# Patient Record
Sex: Female | Born: 1965 | Race: White | Hispanic: No | Marital: Married | Smoking: Never smoker
Health system: Southern US, Community
[De-identification: ages and names within clinical notes are randomized; demographics above are authoritative.]

## PROBLEM LIST (undated history)

## (undated) DIAGNOSIS — E079 Disorder of thyroid, unspecified: Secondary | ICD-10-CM

## (undated) DIAGNOSIS — K589 Irritable bowel syndrome without diarrhea: Secondary | ICD-10-CM

## (undated) DIAGNOSIS — I10 Essential (primary) hypertension: Secondary | ICD-10-CM

## (undated) DIAGNOSIS — R0602 Shortness of breath: Secondary | ICD-10-CM

## (undated) DIAGNOSIS — R7303 Prediabetes: Secondary | ICD-10-CM

## (undated) DIAGNOSIS — M549 Dorsalgia, unspecified: Secondary | ICD-10-CM

## (undated) DIAGNOSIS — R079 Chest pain, unspecified: Secondary | ICD-10-CM

## (undated) DIAGNOSIS — K5900 Constipation, unspecified: Secondary | ICD-10-CM

## (undated) DIAGNOSIS — K219 Gastro-esophageal reflux disease without esophagitis: Secondary | ICD-10-CM

## (undated) HISTORY — DX: Shortness of breath: R06.02

## (undated) HISTORY — DX: Dorsalgia, unspecified: M54.9

## (undated) HISTORY — DX: Gastro-esophageal reflux disease without esophagitis: K21.9

## (undated) HISTORY — DX: Constipation, unspecified: K59.00

## (undated) HISTORY — PX: FOOT SURGERY: SHX648

## (undated) HISTORY — DX: Irritable bowel syndrome, unspecified: K58.9

## (undated) HISTORY — DX: Chest pain, unspecified: R07.9

---

## 2003-09-06 ENCOUNTER — Emergency Department (HOSPITAL_COMMUNITY): Admission: EM | Admit: 2003-09-06 | Discharge: 2003-09-07 | Payer: Self-pay | Admitting: Emergency Medicine

## 2003-09-07 ENCOUNTER — Ambulatory Visit (HOSPITAL_COMMUNITY): Admission: RE | Admit: 2003-09-07 | Discharge: 2003-09-07 | Payer: Self-pay

## 2003-09-20 ENCOUNTER — Encounter (INDEPENDENT_AMBULATORY_CARE_PROVIDER_SITE_OTHER): Payer: Self-pay | Admitting: Specialist

## 2003-09-20 ENCOUNTER — Observation Stay (HOSPITAL_COMMUNITY): Admission: RE | Admit: 2003-09-20 | Discharge: 2003-09-21 | Payer: Self-pay | Admitting: General Surgery

## 2005-12-20 ENCOUNTER — Emergency Department: Payer: Self-pay | Admitting: Emergency Medicine

## 2006-10-17 ENCOUNTER — Emergency Department: Payer: Self-pay | Admitting: Emergency Medicine

## 2006-11-01 ENCOUNTER — Emergency Department: Payer: Self-pay | Admitting: Emergency Medicine

## 2007-04-27 ENCOUNTER — Emergency Department: Payer: Self-pay | Admitting: Unknown Physician Specialty

## 2007-07-20 ENCOUNTER — Emergency Department: Payer: Self-pay | Admitting: Emergency Medicine

## 2008-11-20 ENCOUNTER — Inpatient Hospital Stay (HOSPITAL_COMMUNITY): Admission: EM | Admit: 2008-11-20 | Discharge: 2008-11-23 | Payer: Self-pay | Admitting: Emergency Medicine

## 2008-12-17 ENCOUNTER — Ambulatory Visit (HOSPITAL_BASED_OUTPATIENT_CLINIC_OR_DEPARTMENT_OTHER): Admission: RE | Admit: 2008-12-17 | Discharge: 2008-12-17 | Payer: Self-pay | Admitting: Orthopedic Surgery

## 2009-01-31 ENCOUNTER — Encounter: Admission: RE | Admit: 2009-01-31 | Discharge: 2009-01-31 | Payer: Self-pay | Admitting: Orthopedic Surgery

## 2009-02-28 ENCOUNTER — Encounter: Admission: RE | Admit: 2009-02-28 | Discharge: 2009-02-28 | Payer: Self-pay | Admitting: Orthopedic Surgery

## 2009-03-03 ENCOUNTER — Inpatient Hospital Stay (HOSPITAL_COMMUNITY): Admission: RE | Admit: 2009-03-03 | Discharge: 2009-03-06 | Payer: Self-pay | Admitting: Orthopedic Surgery

## 2009-03-03 ENCOUNTER — Ambulatory Visit: Payer: Self-pay | Admitting: Infectious Diseases

## 2009-03-07 ENCOUNTER — Ambulatory Visit: Payer: Self-pay | Admitting: Orthopedic Surgery

## 2009-05-10 ENCOUNTER — Ambulatory Visit (HOSPITAL_COMMUNITY): Admission: RE | Admit: 2009-05-10 | Discharge: 2009-05-10 | Payer: Self-pay | Admitting: Orthopedic Surgery

## 2009-06-03 ENCOUNTER — Ambulatory Visit (HOSPITAL_COMMUNITY): Admission: RE | Admit: 2009-06-03 | Discharge: 2009-06-04 | Payer: Self-pay | Admitting: Orthopedic Surgery

## 2011-01-13 LAB — DIFFERENTIAL
Basophils Absolute: 0.1 10*3/uL (ref 0.0–0.1)
Basophils Relative: 1 % (ref 0–1)
Eosinophils Absolute: 0.1 10*3/uL (ref 0.0–0.7)
Eosinophils Relative: 2 % (ref 0–5)
Lymphocytes Relative: 37 % (ref 12–46)
Lymphs Abs: 2.1 10*3/uL (ref 0.7–4.0)
Monocytes Absolute: 0.4 10*3/uL (ref 0.1–1.0)
Monocytes Relative: 8 % (ref 3–12)
Neutro Abs: 3 10*3/uL (ref 1.7–7.7)
Neutrophils Relative %: 52 % (ref 43–77)

## 2011-01-13 LAB — CBC
HCT: 33.8 % — ABNORMAL LOW (ref 36.0–46.0)
Hemoglobin: 11.2 g/dL — ABNORMAL LOW (ref 12.0–15.0)
Hemoglobin: 9.3 g/dL — ABNORMAL LOW (ref 12.0–15.0)
MCHC: 33.1 g/dL (ref 30.0–36.0)
MCHC: 33.2 g/dL (ref 30.0–36.0)
MCV: 80.9 fL (ref 78.0–100.0)
MCV: 81.8 fL (ref 78.0–100.0)
Platelets: 242 10*3/uL (ref 150–400)
RBC: 3.42 MIL/uL — ABNORMAL LOW (ref 3.87–5.11)
RBC: 4.19 MIL/uL (ref 3.87–5.11)
RDW: 17.3 % — ABNORMAL HIGH (ref 11.5–15.5)
WBC: 5.7 10*3/uL (ref 4.0–10.5)
WBC: 7.2 10*3/uL (ref 4.0–10.5)

## 2011-01-13 LAB — COMPREHENSIVE METABOLIC PANEL
ALT: 20 U/L (ref 0–35)
AST: 19 U/L (ref 0–37)
Albumin: 3.9 g/dL (ref 3.5–5.2)
Alkaline Phosphatase: 68 U/L (ref 39–117)
BUN: 9 mg/dL (ref 6–23)
CO2: 24 mEq/L (ref 19–32)
Calcium: 9.2 mg/dL (ref 8.4–10.5)
Chloride: 103 mEq/L (ref 96–112)
Creatinine, Ser: 0.64 mg/dL (ref 0.4–1.2)
GFR calc Af Amer: >60 mL/min (ref >60)
GFR calc non Af Amer: >60 mL/min (ref >60)
Glucose, Bld: 102 mg/dL — ABNORMAL HIGH (ref 70–99)
Potassium: 3.8 mEq/L (ref 3.5–5.1)
Sodium: 135 mEq/L (ref 135–145)
Total Bilirubin: 0.8 mg/dL (ref 0.3–1.2)
Total Protein: 7.4 g/dL (ref 6.0–8.3)

## 2011-01-13 LAB — SEDIMENTATION RATE: Sed Rate: 37 mm/hr — ABNORMAL HIGH (ref 0–22)

## 2011-01-13 LAB — APTT: aPTT: 30 seconds (ref 24–37)

## 2011-01-13 LAB — GRAM STAIN

## 2011-01-13 LAB — PROTIME-INR
INR: 1 (ref 0.00–1.49)
Prothrombin Time: 13.4 seconds (ref 11.6–15.2)

## 2011-01-13 LAB — ANAEROBIC CULTURE

## 2011-01-13 LAB — URINALYSIS, ROUTINE W REFLEX MICROSCOPIC
Bilirubin Urine: NEGATIVE
Glucose, UA: NEGATIVE mg/dL
Hgb urine dipstick: NEGATIVE
Ketones, ur: NEGATIVE mg/dL
Nitrite: NEGATIVE
Protein, ur: NEGATIVE mg/dL
Specific Gravity, Urine: 1.028 (ref 1.005–1.030)
Urobilinogen, UA: 0.2 mg/dL (ref 0.0–1.0)
pH: 5 (ref 5.0–8.0)

## 2011-01-13 LAB — BASIC METABOLIC PANEL
CO2: 25 mEq/L (ref 19–32)
Chloride: 103 mEq/L (ref 96–112)
Creatinine, Ser: 0.7 mg/dL (ref 0.4–1.2)
GFR calc Af Amer: >60 mL/min (ref >60)
Potassium: 3.9 mEq/L (ref 3.5–5.1)
Sodium: 135 mEq/L (ref 135–145)

## 2011-01-13 LAB — URINE MICROSCOPIC-ADD ON

## 2011-01-13 LAB — TISSUE CULTURE

## 2011-01-14 LAB — DIFFERENTIAL
Eosinophils Absolute: 0.1 10*3/uL (ref 0.0–0.7)
Eosinophils Relative: 2 % (ref 0–5)
Lymphocytes Relative: 34 % (ref 12–46)
Lymphs Abs: 1.6 10*3/uL (ref 0.7–4.0)
Monocytes Absolute: 0.4 10*3/uL (ref 0.1–1.0)

## 2011-01-14 LAB — CBC
HCT: 32.8 % — ABNORMAL LOW (ref 36.0–46.0)
MCHC: 33.3 g/dL (ref 30.0–36.0)
MCV: 81.2 fL (ref 78.0–100.0)
Platelets: 204 10*3/uL (ref 150–400)
RBC: 4.04 MIL/uL (ref 3.87–5.11)
WBC: 4.8 10*3/uL (ref 4.0–10.5)

## 2011-01-16 LAB — URINE CULTURE: Colony Count: 40000

## 2011-01-16 LAB — BASIC METABOLIC PANEL
BUN: 6 mg/dL (ref 6–23)
CO2: 28 mEq/L (ref 19–32)
CO2: 28 mEq/L (ref 19–32)
CO2: 29 mEq/L (ref 19–32)
Calcium: 8.9 mg/dL (ref 8.4–10.5)
Chloride: 100 mEq/L (ref 96–112)
Chloride: 103 mEq/L (ref 96–112)
Chloride: 104 mEq/L (ref 96–112)
GFR calc Af Amer: >60 mL/min (ref >60)
GFR calc Af Amer: >60 mL/min (ref >60)
Glucose, Bld: 131 mg/dL — ABNORMAL HIGH (ref 70–99)
Potassium: 3.4 mEq/L — ABNORMAL LOW (ref 3.5–5.1)
Potassium: 4.1 mEq/L (ref 3.5–5.1)
Potassium: 4.5 mEq/L (ref 3.5–5.1)
Sodium: 137 mEq/L (ref 135–145)

## 2011-01-16 LAB — WOUND CULTURE

## 2011-01-16 LAB — TISSUE CULTURE: Culture: NO GROWTH

## 2011-01-16 LAB — CBC
HCT: 25.5 % — ABNORMAL LOW (ref 36.0–46.0)
HCT: 27.1 % — ABNORMAL LOW (ref 36.0–46.0)
HCT: 30.3 % — ABNORMAL LOW (ref 36.0–46.0)
HCT: 36.9 % (ref 36.0–46.0)
Hemoglobin: 10 g/dL — ABNORMAL LOW (ref 12.0–15.0)
Hemoglobin: 12.3 g/dL (ref 12.0–15.0)
MCHC: 33.2 g/dL (ref 30.0–36.0)
MCHC: 33.4 g/dL (ref 30.0–36.0)
MCHC: 33.8 g/dL (ref 30.0–36.0)
MCV: 84.5 fL (ref 78.0–100.0)
MCV: 85 fL (ref 78.0–100.0)
Platelets: 197 10*3/uL (ref 150–400)
Platelets: 275 10*3/uL (ref 150–400)
RBC: 3.01 MIL/uL — ABNORMAL LOW (ref 3.87–5.11)
RBC: 3.21 MIL/uL — ABNORMAL LOW (ref 3.87–5.11)
RBC: 3.52 MIL/uL — ABNORMAL LOW (ref 3.87–5.11)
RDW: 15.8 % — ABNORMAL HIGH (ref 11.5–15.5)
RDW: 15.9 % — ABNORMAL HIGH (ref 11.5–15.5)
WBC: 4.6 10*3/uL (ref 4.0–10.5)
WBC: 5.3 10*3/uL (ref 4.0–10.5)

## 2011-01-16 LAB — GRAM STAIN

## 2011-01-16 LAB — DIFFERENTIAL
Basophils Relative: 1 % (ref 0–1)
Lymphocytes Relative: 32 % (ref 12–46)
Lymphs Abs: 2 10*3/uL (ref 0.7–4.0)
Monocytes Relative: 7 % (ref 3–12)
Neutro Abs: 3.7 10*3/uL (ref 1.7–7.7)
Neutrophils Relative %: 59 % (ref 43–77)

## 2011-01-16 LAB — PROTIME-INR
INR: 1 (ref 0.00–1.49)
INR: 1.1 (ref 0.00–1.49)

## 2011-01-16 LAB — ANAEROBIC CULTURE

## 2011-01-16 LAB — URINALYSIS, ROUTINE W REFLEX MICROSCOPIC
Bilirubin Urine: NEGATIVE
Nitrite: NEGATIVE
Specific Gravity, Urine: 1.022 (ref 1.005–1.030)
Urobilinogen, UA: 0.2 mg/dL (ref 0.0–1.0)

## 2011-01-16 LAB — COMPREHENSIVE METABOLIC PANEL
BUN: 10 mg/dL (ref 6–23)
Calcium: 9.8 mg/dL (ref 8.4–10.5)
Creatinine, Ser: 0.66 mg/dL (ref 0.4–1.2)
Glucose, Bld: 100 mg/dL — ABNORMAL HIGH (ref 70–99)
Total Protein: 7.3 g/dL (ref 6.0–8.3)

## 2011-01-16 LAB — URINE MICROSCOPIC-ADD ON

## 2011-01-16 LAB — CULTURE, ROUTINE-ABSCESS

## 2011-01-16 LAB — APTT: aPTT: 28 seconds (ref 24–37)

## 2011-01-16 LAB — HEMOGLOBIN AND HEMATOCRIT, BLOOD: HCT: 26.5 % — ABNORMAL LOW (ref 36.0–46.0)

## 2011-01-16 LAB — TYPE AND SCREEN
ABO/RH(D): A POS
Antibody Screen: NEGATIVE

## 2011-01-23 LAB — BASIC METABOLIC PANEL
CO2: 24 mEq/L (ref 19–32)
Calcium: 8.1 mg/dL — ABNORMAL LOW (ref 8.4–10.5)
Chloride: 104 mEq/L (ref 96–112)
Chloride: 106 mEq/L (ref 96–112)
Chloride: 100 mEq/L (ref 96–112)
Creatinine, Ser: 0.66 mg/dL (ref 0.4–1.2)
Creatinine, Ser: 0.64 mg/dL (ref 0.4–1.2)
GFR calc Af Amer: >60 mL/min (ref >60)
GFR calc Af Amer: >60 mL/min (ref >60)
GFR calc Af Amer: >60 mL/min (ref >60)
Potassium: 3.9 mEq/L (ref 3.5–5.1)

## 2011-01-23 LAB — ABO/RH: ABO/RH(D): A POS

## 2011-01-23 LAB — CBC
HCT: 29.1 % — ABNORMAL LOW (ref 36.0–46.0)
HCT: 30.9 % — ABNORMAL LOW (ref 36.0–46.0)
Hemoglobin: 10.3 g/dL — ABNORMAL LOW (ref 12.0–15.0)
MCHC: 33.9 g/dL (ref 30.0–36.0)
MCV: 82.3 fL (ref 78.0–100.0)
MCV: 82.4 fL (ref 78.0–100.0)
MCV: 83 fL (ref 78.0–100.0)
MCV: 83.2 fL (ref 78.0–100.0)
RBC: 3.04 MIL/uL — ABNORMAL LOW (ref 3.87–5.11)
RBC: 3.5 MIL/uL — ABNORMAL LOW (ref 3.87–5.11)
RBC: 3.75 MIL/uL — ABNORMAL LOW (ref 3.87–5.11)
RBC: 3.35 MIL/uL — ABNORMAL LOW (ref 3.87–5.11)
WBC: 6.3 10*3/uL (ref 4.0–10.5)
WBC: 6.4 10*3/uL (ref 4.0–10.5)
WBC: 9.5 10*3/uL (ref 4.0–10.5)

## 2011-01-23 LAB — POCT I-STAT, CHEM 8
BUN: 13 mg/dL (ref 6–23)
Calcium, Ion: 1.14 mmol/L (ref 1.12–1.32)
Chloride: 108 mEq/L (ref 96–112)
Creatinine, Ser: 0.5 mg/dL (ref 0.4–1.2)
Glucose, Bld: 121 mg/dL — ABNORMAL HIGH (ref 70–99)
HCT: 33 % — ABNORMAL LOW (ref 36.0–46.0)
Hemoglobin: 11.2 g/dL — ABNORMAL LOW (ref 12.0–15.0)
Potassium: 4 mEq/L (ref 3.5–5.1)
Sodium: 140 mEq/L (ref 135–145)
TCO2: 22 mmol/L (ref 0–100)

## 2011-01-23 LAB — TYPE AND SCREEN: Antibody Screen: NEGATIVE

## 2011-01-23 LAB — PREPARE RBC (CROSSMATCH)

## 2011-02-20 NOTE — Op Note (Signed)
Shelby Clarke, Shelby Clarke              ACCOUNT NO.:  [id number]   MEDICAL RECORD NO.:  [id number]          PATIENT TYPE:  OIB   LOCATION:  5012                         FACILITY:  [facility]   PHYSICIAN:  Doralee Albino. Carola Frost, M.D. DATE OF BIRTH:  05-08-1966   DATE OF PROCEDURE:  06/03/2009  DATE OF DISCHARGE:  06/04/2009                               OPERATIVE REPORT   PREOPERATIVE DIAGNOSIS:  Chronic right ankle syndesmotic disruption.   POSTOPERATIVE DIAGNOSIS:  Chronic right ankle syndesmotic disruption.   PROCEDURE:  Right ankle syndesmotic fusion using iliac crest autograft.   SURGEON:  Doralee Albino. Carola Frost, MD   ASSISTANT:  Mearl Latin, PA   ANESTHESIA:  General.   COMPLICATIONS:  None.   SPECIMENS:  Bone at right fibula.  Pathologic findings; no organisms.   TOURNIQUET:  None.   DISPOSITION:  To PACU.   CONDITION:  Stable.   BRIEF SUMMARY OF INDICATIONS FOR PROCEDURE:  Shelby Clarke is a 45-year-  old female with a past medical history notable for syndesmotic  disruption with loss of fixation complicated by infection who underwent  multiple debridements and operations to resolve her osteomyelitis.  She  now presents with syndesmotic instability which is longstanding and a  migrated tibiotalar joint.  I discussed with her preoperatively the  risks and benefits of surgery including possibility of recurrent  infection, need for further surgery, symptomatic hardware, DVT, PE, and  multiple others.  After full discussion, she did wish to proceed.   BRIEF DESCRIPTION OF PROCEDURE:  Ms. Selman was taken to the operating  room where general anesthesia was induced.  Her right lower extremity as  well as her right iliac crest were prepped and draped in usual sterile  fashion.  I began distally, opening the incision laterally, carrying  dissection down to the distal fibula.  We obtained direct bone cultures  and did not identify any gross evidence of infection.  Consequently  using fresh  instruments and attire, we turned our attention to the  proximal hip where we proceeded with bone graft harvesting, carrying the  dissection down to the brim making a trapdoor in the iliac crest,  reflecting the door, and scooping out a large amount of cancellous  autograft.  We were informed that there was no evidence of infection by  pathology of the specimen sent and consequently we turned our attention  back distally where once more thorough debridement of the syndesmosis  was undertaken removing all fibrous tissue.  I then placed some iliac  crest bone directly in the syndesmotic interval as well as placing some  through the posterior aspect of this interval and packing some around  the anterior aspect and this resulted in excellent apposition and fill  of this space followed by placement of a 5-hole, one-third tubular plate  that I extended below the area of the previous fracture.  There appeared  to be quite a bit of bone loss at this level of the fibula and I was  concerned about the possibility of fatigue failure and wanted to augment  this fusion site.  I did not place  any hardware distal to the ankle  joint.  Given that there was again this limited area of contact, I did  not want the screw heads to be prominent.  This resulted in excellent  compression across the syndesmotic interval with an anatomically reduced  tibiotalar joint.  The wound was irrigated and closed in standard  layered fashion with 2-0 Vicryl and 3-0 nylon.  Sterile gently  compressive dressing was applied and a posterior stirrup splint.  The  patient was awakened from anesthesia and transported to PACU in stable  condition.   The proximal hip incision was closed in similar fashion using #1 Vicryl  for the trapdoor periosteum, 0 Vicryl for the deep subcu, and 2-0 Vicryl  and nylon for the skin.  Montez Morita, PA-C, assisted me throughout these  procedures and was necessary for the safe and effective completion  of  the case.   PROGNOSIS:  Ms. Warrick will be nonweightbearing on the right lower  extremity for another 6-8 weeks with graduated weightbearing thereafter.  We will see her back in 10 days for removal of sutures.      Doralee Albino. Carola Frost, M.D.  Electronically Signed     MHH/MEDQ  D:  10/13/2009  T:  10/14/2009  Job:  [id number]

## 2011-02-20 NOTE — Discharge Summary (Signed)
Shelby Clarke, Shelby Clarke              ACCOUNT NO.:  [id number]   MEDICAL RECORD NO.:  [id number]          PATIENT TYPE:  INP   LOCATION:  5023                         FACILITY:  [facility]   PHYSICIAN:  Doralee Albino. Carola Frost, M.D. DATE OF BIRTH:  09-Dec-1965   DATE OF ADMISSION:  03/03/2009  DATE OF DISCHARGE:  03/05/2009                               DISCHARGE SUMMARY   DISCHARGE DIAGNOSES:  1. Infected right ankle fracture involving tibia and fibula with      infection of right ankle joint.  2. Fibula nonunion.  3. Open medial wound right ankle.  4. Loss of syndesmotic reduction, right ankle.  5. Impacted cortical fragment within the talar body and neck from      initial injury.  6. Right ankle posttraumatic contracture.  7. Acute blood loss anemia, asymptomatic and stable.   ADDITIONAL DISCHARGE DIAGNOSES:  Gastroesophageal reflux disease.   PROCEDURES PERFORMED:  1. Partial excision of right tibia for osteomyelitis.  2. Partial excision of right fibula for osteomyelitis.  3. Partial excision of talus for free cortical fragment impacted      within the talar body.  4. Removal of deep implant, right tibia and fibula.  5. Open reduction of ankle and syndesmosis with spanning external      fixation.  6. Application of wound VAC, medial right ankle draining sinus.  7. Arthrotomy with irrigation of right ankle joint.  8. Manipulation of right ankle joint under anesthesia.   BRIEF HISTORY AND HOSPITAL COURSE:  Shelby Clarke is a 45 year old female  who sustained open right ankle fracture approximately 3 months ago.  She  was initially treated with I and D and ORIF of the right ankle at that  time.  Over the next several months, she began to experience several  complications, first of which included loss of reduction of her right  syndesmosis and was subsequently taken back for tight rope fixation of  her right syndesmosis.  She continued to have draining wounds both from  her lateral wound as  well as from the traumatic wound medial aspect of  her right ankle.  After a tight rope fixation of her right ankle, she  began to continue to experience pain and widening of her syndesmosis  with suspected nonunion of fibula.  Shelby Clarke was referred to Dr. Carola Frost  in Orthopedic Trauma Service for workup and subsequent management.  Initial workup was performed on Feb 09, 2009, we did note persistent  draining wounds both medially and laterally and were somewhat suspicious  for deep infection of her right ankle.  As such, we did obtain a CRP and  a sed rate as part of our initial workup.  Her CRP was 7.8 and sed rate  was 36.  We did obtain a CT scan as well, which demonstrated a nonunion  of the distal fibula of the right ankle with union of the medial  malleolar fragment.  We did observe a loose body in the anterior aspect  of the talus suspicious for cortical bone fragment.  We also noted  persistent widening of her syndesmosis as well.  There was significant  arthritis of her right ankle present as well.  As such, we did discussed  multiple treatment options with the patient and her husband of which we  opted for, removal of hardware, irrigation and debridement, and  placement of an external fixator with antibiotic treatment for deep  infection.  Shelby Clarke underwent the procedure described up above and  tolerated the procedures well.  She was placed in an external fixator.  Irrigation and debridement was performed and cultures were obtained from  foreign material from her right ankle most notably the tight rope  suture.  We did obtain an intraoperative Gram stain on the tight rope  suture, which demonstrated Gram-negative rods.  After surgery, Ms.  Clarke was brought to the PACU for recovery from anesthesia and then  transported to orthopedic floor for continued observation and pain  control.  Shelby Clarke was started on IV antibiotics, which included  vancomycin, rifampin, and Zosyn for  broad-spectrum coverage given the  positive Gram stain and suspected infection of her right ankle.  We did  consult Infectious Disease and they followed along closely during the  patient's hospital stay.  Shelby Clarke hospital stay was relatively  uncomplicated, she did stay for 2 days in length.  She progressed well  with physical therapy and had relatively little complaints.  She did  note soreness in her right ankle, but overall progressed each day.  On  postop day #1, Shelby Clarke was doing well, again complaints of soreness  in right lower extremities and nausea, but her nausea did improve with  Reglan and denied any numbness or tingling.  No chest pain or shortness  of breath.  No nausea, vomiting, or diarrhea.  Vital signs were  unremarkable and labs were unremarkable as well.  We did initiate DVT  prophylaxis with Coumadin and Lovenox serving as a bridge given the  anticipated limited mobility over the next several weeks.  Physical exam  was unremarkable.  Her ex-fix was intact.  Hemovac was draining.  Wound  VAC was in place.  No motor or sensory disturbances were noted.  We did  make note of Dr. Bonnetta Barry consult of an Infectious Disease Service and  very grateful for his involvement with the care of this patient.  We did  have hopes that we would be able discharge Shelby Clarke on oral  antibiotics should the bacteria be susceptible to it.  On postop day #2,  the patient continued to do well.  Home health needs were arranged  including services for [facility] changes as well as home health PT and OT as  well as skilled nursing for INR monitoring and Coumadin dosing.  On  postop day #2, the patient was deemed stable for discharge to home with  necessary DME and home health needs.   Clinical encounter note for postop day #2:  Subjective/objective:  The patient is doing well, wants to go home  today, right ankle is sore.  She did have an in-and-out cath last night,  but feels much better.  No  numbness or tingling is noted.  No chest pain  or shortness of breath.  No lightheadedness.  Increased appetite is  noted.   PHYSICAL EXAMINATION:  VITAL SIGNS:  Temperature 98.4, heart rate 81,  respirations 20 at 98% on room air, and BP 123/68.  GENERAL:  The patient is awake and alert in no acute distress.  LUNGS:  Clear.  CARDIAC:  S1 and S2 are noted.  ABDOMEN:  Soft, nontender.  Positive bowel sounds.  EXTREMITIES:  Right lower extremity, external fixator is intact.  Pin  sites look outstanding.  VAC is in place on the medial right ankle.  Hemovac has been discontinued.  Dressings has been changed and incisions  are clean, dry, and intact.  No erythema or purulent drainage is noted.  Scant serosanguineous discharge is noted.  EHL, FHL, and lesser toe  function are intact.  Deep peroneal nerve, superficial peroneal nerve,  and tibial nerve motor function are intact.  No deep calf tenderness is  noted.  Extremities are warm.  Palpable dorsalis pedis pulse is noted.  There is edema of the foot noted.   Sodium 138, potassium 4.1, chloride 100, bicarb 28, BUN 6, creatinine  0.76, and glucose 120.  Hemoglobin 8.6, hematocrit 25.5, white blood  cells 5.3, platelets 197, and INR is 2.1.  Final cultures are back on  the aerobic specimens, which demonstrate pseudomonas aeruginosa, which  is sensitive to Cipro.  Anaerobic cultures are still pending, but no  growth to date.   ASSESSMENT AND PLAN:  A 45 year old female status post removal of  hardware of right ankle and incision and drainage for right ankle  osteomyelitis with application of spanning external fixator right ankle  on postop day #2.  1. Right ankle osteomyelitis status post incision and drainage and      external fixation.      a.     Nonweightbearing right lower extremity.      b.     Physical therapy and occupational therapy.  Home health PT       and OT have been arranged.      c.     Toe range of motion encouraged for  swelling control as is       elevation and ice on the right lower extremity and particularly       elevation above the heart.      d.     Dressings changed today except for the VAC.  Her Hemovac       drain was discontinued as well.      e.     The patient will need VAC changes tomorrow by home health       and will require dressing VAC changes every 3 days.  We anticipate       2-3 weeks of VAC treatment, but may extend beyond that depending       on the condition of the wound.  Ultimate goal will be delayed       primary closure versus flap.  2. Gastroesophageal reflux disease.  Continue with Protonix.  3. Infectious Disease.  Cultures currently show Pseudomonas      aeruginosa, which is sensitive to Cipro and no growth to date from      anaerobic culture.  We will change antibiotic regimen to Cipro 750      mg p.o. b.i.d. x6 week.  We will follow up cultures on Tuesday and      adjust antibiotics accordingly until then we will discharge the      patient home with Cipro.  I greatly appreciate Infectious Disease      involvement.  4. Wound care.  VAC change tomorrow, right ankle by home health.  5. Pain.  Continue with p.o. medications.  6. Fluids, electrolytes, nutrition.  The patient must void on her own      before discharge home today.  7. Acute blood loss  anemia.  Asymptomatic and stable.  8. The patient is relatively young and should have ample reserve thus      making transfusion not necessary.  9. Disposition.  Discharged home today with home health once the      patient voids on own.  10.Antibiotic therapy.  We will include Cipro 750 mg p.o. b.i.d. x6      weeks and again, we will adjust her as needed based on anaerobic      cultures.   DISCHARGE MEDICATIONS:  1. Percocet 5/325 one to two p.o. q.4-6 h. as needed for pain.  2. Oxycodone 5 mg 1-2 every 3 hours as needed for breakthrough pain.  3. Robaxin 500 mg 1-2 every 6 hours as needed for muscle spasms.  4. Coumadin 5  mg take as directed, which will be monitored in despite      home health pharmacy.  5. Ciprofloxacin 750 mg 1 p.o. every 12 hours for 6 weeks.  I given      cause concerns.  I have written 2 Cipro prescriptions for Cipro, 1      for 250 mg and 1 for 500 mg, which will be taken together b.i.d. to      create a 750 mg tablet.   ADDITIONAL MEDICATIONS:  The patient has no home medications to resume.   DISCHARGE INSTRUCTIONS AND PLANS:  Ms. Schlafer did sustain a significant  injury to her right lower extremity.  She will continue to be  nonweightbearing on her right lower extremity while in the external  fixator.  She will continue on antibiotics as prescribed for coverage of  her deep infection.  We do anticipate return to the OR in the near  future for eventual closure of her medial wound.  However, until that  time, she will continue with vac changes to the medial wound of her  right ankle every 3 days, which will be carried out by home health.  After wound are healed and infections are cleared, we would anticipate  return to the OR at some point in the future for fusion of the  syndesmosis.  However, we are hope for that she may go on and scar and  not require any further open procedures.  Given her anticipated  immobility as well as obesity.  She does remain at an elevated risk for  the development of the DVT.  As such, she will be on Coumadin for DVT  prophylaxis for the next 6 weeks.  Her INR upon discharge was in  therapeutic range of 2.1.  With regards to wound care, I did provide Ms.  Cuen with a wound care sheet, now which clearly notes proper care for  her external fixator as well as care for her surgical wounds.  Again,  VAC changes will be performed by the home health agency and did suggest  to Ms. Pacifico that prior to [facility] changes she should take pain medication  to make VAC change more comfortable.  Ms. Rabon will be discharged home  with home health PT and OT, which she  should participate in while  maintaining nonweightbearing status on her right lower extremity.  She  should continue with digit range of motion, ice, and elevation of her  right lower extremity as well as Ace wrapping to help with swelling  control.  We will see Ms. Schaible back in 10-14 days for reevaluation  hopefully her surgical wound have healed in and sutures can be removed.  At that  time, we will also evaluate the medial wound, which has been  treated with the wound VAC for closure.  Again, Ms. Suchecki will be on  antibiotic treatment for over next 6 weeks, which at this point will  include Cipro 750 mg p.o. b.i.d. based on current final aerobic  cultures.  Anaerobic cultures are still pending and any antibiotic  adjustments will be made on follow up.  We are hopeful that no  additional organisms will grow out and oral ciprofloxacin will be the  sole treatment.  Again, we greatly appreciate the assistance of the  Infectious Disease Service during Ms. Asmus's hospital stay.  Should  the patient have any questions or any concern prior to follow visit, she  is to feel free to contact the office and we will address her issues or  concerns immediately.  Again, with regards to DVT prophylaxis, she will  be on Coumadin therapy, again this will be monitored by the [facility] and dosed out by the Pharmacy as well.      Mearl Latin, PA      Doralee Albino. Carola Frost, M.D.  Electronically Signed    KWP/MEDQ  D:  03/05/2009  T:  03/06/2009  Job:  [id number]

## 2011-02-20 NOTE — Op Note (Signed)
Shelby Clarke, [place]              ACCOUNT NO.:  [id number]   MEDICAL RECORD NO.:  [id number]          PATIENT TYPE:  AMB   LOCATION:  DSC                          FACILITY:  [facility]   PHYSICIAN:  Mila Homer. Sherlean Foot, M.D. DATE OF BIRTH:  Jul 03, 1966   DATE OF PROCEDURE:  12/17/2008  DATE OF DISCHARGE:                               OPERATIVE REPORT   SURGEON:  Mila Homer. Sherlean Foot, M.D.   ASSISTANT:  None.   ANESTHESIA:  General.   PREOPERATIVE DIAGNOSIS:  Right syndesmotic injury, status post  bimalleolar open reduction internal fixation.   POSTOPERATIVE DIAGNOSIS:  Right syndesmotic injury status post  bimalleolar open reduction internal fixation.   PROCEDURE:  Right syndesmotic repair.   INDICATION FOR PROCEDURE:  The patient is a 45 year old white female  status post a very complicated fracture dislocation in the ankle and  subsequent ORIF by me 3 weeks ago.  Subsequently, the mortise from the  medial side was wide enough I felt syndesmotic repair was necessary.  Informed consent obtained.   DESCRIPTION OF PROCEDURE:  The patient was laid supine, administered  general anesthesia, the right leg was prepped and draped in usual  sterile fashion.  I used the OC device C-arm to find where I would a  make percutaneous incision, made that through the previous lateral  incision, and bluntly dissected down to the plate.  I then placed the  tight wire from [employer] and with the foot in 90 degrees neutral flexion,  squeezed down and tied that down tightly.  I did get some narrowing of  the medial mortise.  I then lavaged and closed with 0 Vicryl and 3-0  vertical mattress sutures there.  I then lavaged the skin and soft  tissue where the traumatic wound was medially and then dressed with  Xeroform, dry sponges, sterile Webril and Ace wrap.   COMPLICATIONS:  None.   DRAINAGE:  None.           ______________________________  Mila Homer. Sherlean Foot, M.D.     SDL/MEDQ  D:  12/17/2008  T:   12/17/2008  Job:  [id number]

## 2011-02-20 NOTE — Op Note (Signed)
[name], [name]              ACCOUNT NO.:  [id number]   MEDICAL RECORD NO.:  [id number]          PATIENT TYPE:  INP   LOCATION:  5023                         FACILITY:  [facility]   PHYSICIAN:  Doralee Albino. Carola Frost, M.D. DATE OF BIRTH:  [birth year]   DATE OF PROCEDURE:  03/03/2009  DATE OF DISCHARGE:                               OPERATIVE REPORT   PREOPERATIVE DIAGNOSES:  1. Infected right ankle fracture involving the tibia and fibula.  2. Suspected infection of the ankle joint.  3. Fibula nonunion.  4. Open medial wound.  5. Loss of syndesmotic reduction.  6. Impacted cortical fragment within the talar body and neck from      initial injury.  7. Right ankle posttraumatic contracture.   POSTOPERATIVE DIAGNOSES:  1. Infected right ankle fracture involving the tibia and fibula.  2. Suspected infection of the ankle joint.  3. Fibula nonunion.  4. Open medial wound.  5. Loss of syndesmotic reduction.  6. Impacted cortical fragment within the talar body and neck from      initial injury.  7. Right ankle posttraumatic contracture.   PROCEDURES:  1. Partial excision of tibia for osteomyelitis.  2. Partial excision of fibula for osteomyelitis.  3. Partial excision of talus for free cortical fragment impacted      within the talar body.  4. Removal of a deep implant tibia and fibula.  5. Open reduction of the ankle and syndesmosis with spanning external      fixation.  6. Application of wound VAC to the medial draining sinus.  7. Arthrotomy with irrigation of the ankle joint.  8. Manipulation of ankle joint under anesthesia.   SURGEON:  Doralee Albino. Carola Frost, MD   ASSISTANT:  Mearl Latin, PA   ANESTHESIA:  General.   SPECIMENS:  Five, all sent to Microbiology for Gram stain and culture.  Gram stain findings notable for Gram-negative rods on the TightRope  device.   TOURNIQUET:  None.   ESTIMATED BLOOD LOSS:  150 mL.   COMPLICATIONS:  None.   DRAINS:  One wound VAC.   DISPOSITION:  To PACU.   CONDITION:  Stable.   BRIEF SUMMARY OF INDICATION FOR PROCEDURE:  Shelby Clarke is a 45-year-  old female, status post an open ankle fracture 3 months ago.  She was  treated with internal fixation including use of the TightRope device to  repair syndesmosis.  The patient has had a persistent low-level drainage  since that time, both medially and laterally.  CRP and sed rate were  elevated preoperatively.  The patient also had an ankle contracture that  developed as a result of her prolonged splint wear and pain restricting  motion.  I discussed with her preoperatively the risks and benefits of  surgery including the possibility of failure to resolve her infection,  need for further surgery including fusion and syndesmosis, failure to  maintain her ankle range of motion, and multiple others including DVT,  PE, and anesthetic and antibiotic complications.  After full discussion,  she and her husband wished to proceed.   BRIEF SUMMARY OF PROCEDURE:  Ms.  Clarke was taken to operating room  where general anesthesia was induced.  There was some miscommunication  with Anesthesia staff, and the patient did receive 2 g of Ancef prior to  beginning the procedure with the plan having been to hold antibiotics  until we had obtained cultures.  The right lower extremity was scrubbed  with a chlorhexidine brush and then a standard prep and drape performed  after that with Betadine.  I began medially by excising the draining  sinus and necrotic tissue lining the sinus tract down to the medial  tibia.  This was in line with the old horizontal traumatic incision.  The sinus tract down to the FiberWire suture, this was removed sharply,  and this material sent for culture, and Gram stain yielding a result of  positive for Gram-negative rods.  There was extensive fibrinous material  and soft necrotic bone lining this tract suture.  Serial curettes were  used to follow it along.   After a brief irrigation, then turned  attention to the lateral side where the lateral wound was reopened and  here again I ellipsed the draining sinus, which track down to the bone  and the TightRope device as well.  I then removed all the screws and  plate as well as the remaining TightRope.  Curettes were also passed  into the screw holes.  The distal fibula fracture site was examined and  found to be a nonunion consistent with our expectation.  There was some  debris in the anterior syndesmosis and some bone here was excised out.  I also used a curette and excised all the infected necrotic bone along  the rather large tunnel through the bone in the area of the TightRope.  This was thoroughly irrigated.  I then extended the incision distally to  enable an arthrotomy of the tibiotalar joint and also to try to find  identify and remove a free fragment of bone that we felt it would be a  nidus for infection, which was driven at the time of fracture into the  talus, though it clearly came from out of the fibula or the tibia and  was cortical in nature.  C-arm assisted with identification of this  fragment.  I then used the Glorious Peach passing it on either side of the  cortical segment and then withdrawing it with a hemostat.  The bone was  quite soft around it, but there was no cavity to suggest that, it was a  regional site of infection.  The arthrotomy into the ankle joint was  used to aggressively irrigate with a bulb syringe the joint while taken  it through range of motion.  At that point, we realized we would be  unable to restore her foot to a plantigrade position as it contracted  down with at least 10-degree equinus contracture.  Consequently, at that  point, the knee was brought into flexion and then a sustain force  applied to the ankle to manipulate it while the patient remained under  anesthesia producing about 15-20 degrees of ankle extension.  At that  point, we then aggressively  irrigated all the bone qualities curetting  the soft tissue edges, periosteum, and anything that was exposed to  remove as much infection as possible.  Fresh Attire and drapes were  applied.  An essentially threaded calcaneal pin was then passed from  medial to lateral through a small stab incision and C-arm was brought in  to see if we could  manipulate the ankle into a reduced position.  This  did restore appropriate tibiotalar alignment and consequently, we felt  that application of the fixator would aid in maintaining reduction as  well as maintaining the gains associated with manipulation of the ankle  by keeping the foot in a plantigrade position.  Two tibial pins were  added and then 2 metatarsal pins and a final construct showed  appropriate reduction in AP lateral and mortise films with again  restoration of ankle extension.  The wounds were closed in standard  layered fashion using 2-0 PDS and 3-0 nylon sutures.  The medial wound  was not closable after excision of the draining sinus tract though she  does have fairly significant soft tissue and I think she was a candidate  for closure with a wound VAC without the need for a free flap,  particularly given the fact that all of her hardware was removed.  Of  note, a separate longitudinal incision at the base of the medial  malleolus had to be made to identify and remove the medial malleolar  screws.  The wound VAC was placed over part of the traumatic wound  incision that was sealed with an Adaptic over that and then was also  placed deep within the cavity to eliminate as much dead space as  possible.  Sterile gentle compressive dressings were applied.  The  patient was then awakened from anesthesia and transported to PACU in  stable condition.  Montez Morita, PA-C assisted me throughout the case and  all procedures, and I was necessary for the safe and effective  completion of the case.   PROGNOSIS:  Ms. Rathel will be  nonweightbearing on the right lower  extremity and will be on broad-spectrum antibiotics with Gram-negative  coverage pending final culture results.  We asked Infectious Disease  Service to consult and weigh in regarding specific recommendations and  length of treatment.  We anticipate return to the OR at some point in  the future for a fusion of the syndesmosis, but are hopeful that she may  go on and scar in and require further open procedures, which of course  would carry an increased risk of infection.  She will be on DVT  prophylaxis with Lovenox for the early goings and then most likely  bridge to Coumadin as we anticipate a long-term course of IV antibiotic  treatment.      Doralee Albino. Carola Frost, M.D.  Electronically Signed     MHH/MEDQ  D:  03/03/2009  T:  03/04/2009  Job:  [id number]
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
## 2011-02-20 NOTE — Op Note (Signed)
Shelby Clarke, Shelby Clarke              ACCOUNT NO.:  [id number]   MEDICAL RECORD NO.:  [id number]          PATIENT TYPE:  AMB   LOCATION:  SDS                          FACILITY:  [facility]   PHYSICIAN:  Doralee Albino. Carola Frost, M.D. DATE OF BIRTH:  [birth year]   DATE OF PROCEDURE:  05/10/2009  DATE OF DISCHARGE:  05/10/2009                               OPERATIVE REPORT   PREOPERATIVE DIAGNOSES:  1. Retained external fixator, right ankle.  2. Previous infection with removal of syndesmotic fixation.   POSTOPERATIVE DIAGNOSES:  1. Retained external fixator, right ankle.  2. Previous infection with removal of syndesmotic fixation.   PROCEDURES:  1. Removal of external fixator, right ankle.  2. Stress examination of the syndesmosis under fluoroscopy.   SURGEON:  Doralee Albino. Carola Frost, MD   ASSISTANT:  Mearl Latin, PA   ANESTHESIA:  General.   COMPLICATIONS:  None.   ESTIMATED BLOOD LOSS:  Minimal.   DISPOSITION:  To PACU.   CONDITION:  Stable.   BRIEF SUMMARY AND INDICATIONS FOR PROCEDURE:  Shelby Clarke is a 45-  year-old female who sustained an open right ankle fracture with  disruption of her syndesmosis.  She underwent fixation complicated by  infection, subsequent hardware removal.  She has been an external  fixator for the last 8 weeks with IV antibiotics and now presents for  removal of the frame with stress of the syndesmosis.  She understood the  risks to include need for further surgery, new infection, failure to  regain range of motion, DVT, PE, and others.  After full discussion, she  wished to proceed.   BRIEF DESCRIPTION OF PROCEDURE:  [name] was administered  preoperative Ancef, taken to the operating room where general anesthesia  was induced.  Her right lower extremity then underwent removal of the  frame of pin sites looked excellent with zero sign of infection.  Consequently, the pin sites were not curetted.  After removal of the  frame, aggressive scrub brush was  used to remove any debris around the  pin site.  C-arm was then brought in and a stress evaluation performed  of the syndesmosis using external rotation.  There was no gross  instability widening or translation of the talus with perspective tibia.  They did appear to be very slight motion of just about a millimeter old,  but not more than 2 mm.  The patient was then underwent application of a  sterile dressing and her CAM boot and was taken to the PACU in stable  condition.   PROGNOSIS:  [name] remain nonweightbearing with dressing changes and  reapplication of the boot until she returns to the office in 14 days.  At that time, we would anticipate a new films conversion to an Aircast  Stirrup brace and then initiation of graduated weightbearing.      Doralee Albino. Carola Frost, M.D.  Electronically Signed     Doralee Albino. Carola Frost, M.D.  Electronically Signed    MHH/MEDQ  D:  05/10/2009  T:  05/10/2009  Job:  [id number]
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
## 2011-02-23 NOTE — Op Note (Signed)
Shelby Clarke, Shelby Clarke                        ACCOUNT NO.:  [id number]   MEDICAL RECORD NO.:  [id number]                   PATIENT TYPE:  AMB   LOCATION:  DAY                                  FACILITY:  [facility]   PHYSICIAN:  Angelia Mould. Derrell Lolling, M.D.             DATE OF BIRTH:  Nov 29, 1965   DATE OF PROCEDURE:  09/20/2003  DATE OF DISCHARGE:                                 OPERATIVE REPORT   PREOPERATIVE DIAGNOSIS:  Chronic cholecystitis with cholelithiasis.   POSTOPERATIVE DIAGNOSIS:  Chronic cholecystitis with cholelithiasis.   OPERATION PERFORMED:  Laparoscopic cholecystectomy.   SURGEON:  Angelia Mould. Derrell Lolling, M.D.   FIRST ASSISTANT:  Gabrielle Dare. Janee Morn, M.D.   OPERATIVE INDICATION:  This is a 45 year old white female who has a one-year  history of vague complaints of nausea, diarrhea, and right back pain.  More  recently she has been having more severe right upper quadrant pain with  bloating, nausea, and vomiting after meals.  Recent emergency room  evaluation confirmed gallstones in the gallbladder, but her lab work was  normal.  She is brought to the operating room electively.   OPERATIVE FINDINGS:  The gallbladder was chronically inflamed, somewhat  discolored, had adhesions to the lower half of it.  The liver looked normal.  The cystic duct was very tiny and could not be cannulated due to multiple  valves.  The anatomy of the cystic duct, cystic artery, and common bile duct  appeared conventional.  The stomach, duodenum, small intestine, and large  intestine and peritoneal surfaces were normal to inspection.   OPERATIVE TECHNIQUE:  Following the induction of general endotracheal  anesthesia, the patient's abdomen was prepped and draped in a sterile  fashion.  A vertically-oriented incision was made at the lower end of the  umbilicus.  The fascia was incised in the midline, the abdominal cavity  entered under direct vision.  A 10 mm Hasson trocar was inserted and secured  with a pursestring suture of 0 Vicryl.  Pneumoperitoneum was created.  The  video camera was inserted with visualization and findings as described  above.  A 10 mm trocar was placed in the subxiphoid region and two 5 mm  trocars placed in the right midabdomen.  The gallbladder fundus was  elevated.  The adhesions to the body and the lower gallbladder were taken  down.  The infundibulum was retracted to the right.  We dissected out the  cystic duct and the cystic artery.  We isolated the cystic artery as it went  onto the gallbladder wall, secured it with multiple metal clips, and divided  it.  We created a very large window behind the cystic duct until we could  see the anatomy quite clearly.  The common bile duct was also visible.  We  placed a clip on the cystic duct close to the gallbladder.  We opened the  cystic duct with the scissors, and we had  clear bile coming back.  The lumen  of the cystic duct was very tiny.  We made multiple attempts to insert a  Cook catheter into the cystic duct, but we never could thread the catheter  very far presumably due to multiple valves and small lumen.  Since the  patient's liver function tests were normal and the anatomy appeared quite  clear, we chose to abandon the cholangiogram.  The cystic duct was secured  with multiple metal clips and divided.  The gallbladder was dissected from  its bed with electrocautery and removed through the umbilical port.  The  operative field was copiously irrigated.  Hemostasis was excellent.  At the  completion of the case there was no bleeding, no bile leak whatsoever, and  the irrigation fluid was clear.  The trocars were removed under direct  vision and there was no bleeding from the trocar sites.  The  pneumoperitoneum was released.  The fascia at the umbilicus was closed with  0 Vicryl suture.  Skin incisions were  closed with subcuticular sutures of 4-0 Vicryl and Steri-Strips.  Clean  bandages were placed and  the patient taken to the recovery room in stable  condition.  The estimated blood loss was about 10 mL.  Complications:  None.  Sponge, needle, and instrument counts were correct.                                               Angelia Mould. Derrell Lolling, M.D.    HMI/MEDQ  D:  09/20/2003  T:  09/20/2003  Job:  [id number]

## 2013-10-27 ENCOUNTER — Emergency Department: Payer: Self-pay | Admitting: Emergency Medicine

## 2013-10-27 LAB — RAPID INFLUENZA A&B ANTIGENS

## 2014-05-05 ENCOUNTER — Emergency Department: Payer: Self-pay | Admitting: Emergency Medicine

## 2014-05-05 LAB — URINALYSIS, COMPLETE
BACTERIA: NONE SEEN
Bilirubin,UR: NEGATIVE
Blood: NEGATIVE
GLUCOSE, UR: NEGATIVE mg/dL (ref 0–75)
KETONE: NEGATIVE
Leukocyte Esterase: NEGATIVE
Nitrite: NEGATIVE
PROTEIN: NEGATIVE
Ph: 5 (ref 4.5–8.0)
RBC,UR: NONE SEEN /HPF (ref 0–5)
SPECIFIC GRAVITY: 1.023 (ref 1.003–1.030)
Squamous Epithelial: 1
WBC UR: NONE SEEN /HPF (ref 0–5)

## 2014-05-05 LAB — COMPREHENSIVE METABOLIC PANEL
Albumin: 3.6 g/dL (ref 3.4–5.0)
Alkaline Phosphatase: 75 U/L
Anion Gap: 5 — ABNORMAL LOW (ref 7–16)
BILIRUBIN TOTAL: 0.2 mg/dL (ref 0.2–1.0)
BUN: 13 mg/dL (ref 7–18)
CALCIUM: 8.7 mg/dL (ref 8.5–10.1)
CO2: 28 mmol/L (ref 21–32)
Chloride: 105 mmol/L (ref 98–107)
Creatinine: 0.68 mg/dL (ref 0.60–1.30)
EGFR (African American): >60
EGFR (Non-African Amer.): >60
Glucose: 90 mg/dL (ref 65–99)
Osmolality: 275 (ref 275–301)
Potassium: 3.9 mmol/L (ref 3.5–5.1)
SGOT(AST): 15 U/L (ref 15–37)
SGPT (ALT): 30 U/L
SODIUM: 138 mmol/L (ref 136–145)
TOTAL PROTEIN: 7.6 g/dL (ref 6.4–8.2)

## 2014-05-05 LAB — CBC
HCT: 37.9 % (ref 35.0–47.0)
HGB: 12 g/dL (ref 12.0–16.0)
MCH: 27.6 pg (ref 26.0–34.0)
MCHC: 31.6 g/dL — AB (ref 32.0–36.0)
MCV: 87 fL (ref 80–100)
Platelet: 232 10*3/uL (ref 150–440)
RBC: 4.34 10*6/uL (ref 3.80–5.20)
RDW: 16.1 % — AB (ref 11.5–14.5)
WBC: 6.7 10*3/uL (ref 3.6–11.0)

## 2015-12-14 ENCOUNTER — Other Ambulatory Visit: Payer: Self-pay | Admitting: Family Medicine

## 2015-12-14 DIAGNOSIS — Z1231 Encounter for screening mammogram for malignant neoplasm of breast: Secondary | ICD-10-CM

## 2019-02-02 ENCOUNTER — Emergency Department: Payer: 59

## 2019-02-02 ENCOUNTER — Other Ambulatory Visit: Payer: Self-pay

## 2019-02-02 ENCOUNTER — Emergency Department
Admission: EM | Admit: 2019-02-02 | Discharge: 2019-02-02 | Disposition: A | Discharge status: Home / Self Care | Payer: 59 | Attending: Emergency Medicine | Admitting: Emergency Medicine

## 2019-02-02 DIAGNOSIS — M549 Dorsalgia, unspecified: Secondary | ICD-10-CM | POA: Insufficient documentation

## 2019-02-02 DIAGNOSIS — R109 Unspecified abdominal pain: Secondary | ICD-10-CM

## 2019-02-02 LAB — URINALYSIS, COMPLETE (UACMP) WITH MICROSCOPIC
Bacteria, UA: NONE SEEN
Bilirubin Urine: NEGATIVE
Glucose, UA: NEGATIVE mg/dL
Hgb urine dipstick: NEGATIVE
Ketones, ur: NEGATIVE mg/dL
Nitrite: NEGATIVE
Protein, ur: NEGATIVE mg/dL
Specific Gravity, Urine: 1.023 (ref 1.005–1.030)
pH: 5 (ref 5.0–8.0)

## 2019-02-02 LAB — CBC
HCT: 37.8 % (ref 36.0–46.0)
Hemoglobin: 11.9 g/dL — ABNORMAL LOW (ref 12.0–15.0)
MCH: 28.8 pg (ref 26.0–34.0)
MCHC: 31.5 g/dL (ref 30.0–36.0)
MCV: 91.5 fL (ref 80.0–100.0)
Platelets: 210 10*3/uL (ref 150–400)
RBC: 4.13 MIL/uL (ref 3.87–5.11)
RDW: 13.6 % (ref 11.5–15.5)
WBC: 5.4 10*3/uL (ref 4.0–10.5)
nRBC: 0 % (ref 0.0–0.2)

## 2019-02-02 LAB — BASIC METABOLIC PANEL
Anion gap: 7 (ref 5–15)
BUN: 18 mg/dL (ref 6–20)
CO2: 24 mmol/L (ref 22–32)
Calcium: 8.8 mg/dL — ABNORMAL LOW (ref 8.9–10.3)
Chloride: 107 mmol/L (ref 98–111)
Creatinine, Ser: 0.6 mg/dL (ref 0.44–1.00)
GFR calc Af Amer: >60 mL/min (ref >60)
GFR calc non Af Amer: >60 mL/min (ref >60)
Glucose, Bld: 104 mg/dL — ABNORMAL HIGH (ref 70–99)
Potassium: 4 mmol/L (ref 3.5–5.1)
Sodium: 138 mmol/L (ref 135–145)

## 2019-02-02 LAB — HEPATIC FUNCTION PANEL
ALT: 21 U/L (ref 0–44)
AST: 19 U/L (ref 15–41)
Albumin: 4.1 g/dL (ref 3.5–5.0)
Alkaline Phosphatase: 69 U/L (ref 38–126)
Bilirubin, Direct: <0.1 mg/dL (ref 0.0–0.2)
Total Bilirubin: 0.4 mg/dL (ref 0.3–1.2)
Total Protein: 7 g/dL (ref 6.5–8.1)

## 2019-02-02 LAB — LIPASE, BLOOD: Lipase: 24 U/L (ref 11–51)

## 2019-02-02 MED ORDER — HYDROCODONE-ACETAMINOPHEN 5-325 MG PO TABS: 1.0000 | ORAL_TABLET | ORAL | 0 refills | Status: DC | PRN

## 2019-02-02 MED ORDER — TRAMADOL HCL 50 MG PO TABS: 50.0000 mg | ORAL_TABLET | Freq: Four times a day (QID) | ORAL | 0 refills | Status: DC | PRN

## 2019-02-02 NOTE — ED Notes (Signed)
Patient is sitting on bench for comfort. Will defer Vital signs at this time.

## 2019-02-02 NOTE — ED Provider Notes (Signed)
[facility] Emergency Department Provider Note  Time seen: 6:16 AM  I have reviewed the triage vital signs and the nursing notes.   HISTORY  Chief Complaint Flank Pain    HPI Shelby Clarke is a 53 y.o. female with no significant past medical history who presents to the emergency department for back pain and left flank pain.   According to the patient over the past 2 days the patient has been experiencing lower back pain especially off to the left flank.  Patient denies any hematuria or dysuria but does state urinary frequency.  No history of kidney stones.  No history of colitis or diverticulitis.  No nausea vomiting or diarrhea.  No fever cough or congestion.  No past medical history on file.  There are no active problems to display for this patient.   Prior to Admission medications   Not on File    Allergies no known allergies  No family history on file.  Social History Social History   Tobacco Use  . Smoking status: Not on file  Substance Use Topics  . Alcohol use: Not on file  . Drug use: Not on file    Review of Systems Constitutional: Negative for fever. ENT: Negative for recent illness/congestion Cardiovascular: Negative for chest pain. Respiratory: Negative for shortness of breath.  Negative for cough. Gastrointestinal: Mild left flank pain. Genitourinary: Urinary frequency without dysuria or hematuria Musculoskeletal: Moderate lower back pain especially left-sided Skin: Negative for skin complaints  Neurological: Negative for headache All other ROS negative  ____________________________________________   PHYSICAL EXAM:  VITAL SIGNS: ED Triage Vitals  Enc Vitals Group     BP 02/02/19 0421 (!) 146/81     Pulse Rate 02/02/19 0421 66     Resp 02/02/19 0421 20     Temp 02/02/19 0421 97.6 F (36.4 C)     Temp Source 02/02/19 0421 Oral     SpO2 02/02/19 0421 95 %     Weight 02/02/19 0416 300 lb (136.1 kg)     Height  02/02/19 0416 5\' 6"  (1.676 m)     Head Circumference --      Peak Flow --      Pain Score 02/02/19 0416 7     Pain Loc --      Pain Edu? --      Excl. in [place]? --    Constitutional: Alert and oriented. Well appearing and in no distress. Eyes: Normal exam ENT      Head: Normocephalic and atraumatic.      Mouth/Throat: Mucous membranes are moist. Cardiovascular: Normal rate, regular rhythm. Respiratory: Normal respiratory effort without tachypnea nor retractions. Breath sounds are clear Gastrointestinal: Soft, nontender abdomen with mild left CVA tenderness to palpation. Musculoskeletal: Nontender with normal range of motion in all extremities. Neurologic:  Normal speech and language. No gross focal neurologic deficits  Skin:  Skin is warm, dry and intact.  Psychiatric: Mood and affect are normal.   ____________________________________________   RADIOLOGY  IMPRESSION: 1. No acute or focal abnormality to explain flank pain. 2. Normal CT appearance of the abdomen. 3. 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 This could contribute to back pain.  ____________________________________________   INITIAL IMPRESSION / ASSESSMENT AND PLAN / ED COURSE  Pertinent labs & imaging results that were available during my care of the patient were reviewed by me and considered in my medical decision making (see chart for details).   Patient presents to the  emergency department for lower back pain and left flank pain.  Differential would include ureterolithiasis, UTI, pyelonephritis, colitis or diverticulitis, musculoskeletal pain.  We will check labs, CT renal scan and continue to closely monitor.  Patient agreeable to plan of care.  Patient's lab work is largely within normal limits.  Urine culture has been sent as a precaution.  Blood work is nonrevealing.  CT scan is largely negative as well besides degenerative facet changes which could cause back pain.  We will discharge  the patient home with a short course of pain medication.  Patient agreeable to plan of care.  [name] was evaluated in Emergency Department on 02/02/2019 for the symptoms described in the history of present illness. She was evaluated in the context of the global COVID-19 pandemic, which necessitated consideration that the patient might be at risk for infection with the SARS-CoV-2 virus that causes COVID-19. Institutional protocols and algorithms that pertain to the evaluation of patients at risk for COVID-19 are in a state of rapid change based on information released by regulatory bodies including the CDC and federal and state organizations. These policies and algorithms were followed during the patient's care in the ED.  ____________________________________________   FINAL CLINICAL IMPRESSION(S) / ED DIAGNOSES  Back pain    Harvest Dark, MD 02/02/19 [phone]

## 2019-02-02 NOTE — ED Triage Notes (Signed)
Patient reports lower back pain, especially left flank since Saturday night that has gotten progressively worse.  Also reports urinary urgency.

## 2019-06-10 DIAGNOSIS — R152 Fecal urgency: Secondary | ICD-10-CM | POA: Insufficient documentation

## 2019-06-10 DIAGNOSIS — R159 Full incontinence of feces: Secondary | ICD-10-CM | POA: Insufficient documentation

## 2019-06-10 DIAGNOSIS — N3946 Mixed incontinence: Secondary | ICD-10-CM | POA: Insufficient documentation

## 2019-10-29 ENCOUNTER — Telehealth: Payer: 59 | Admitting: Physician Assistant

## 2019-10-29 DIAGNOSIS — R05 Cough: Secondary | ICD-10-CM

## 2019-10-29 DIAGNOSIS — Z20822 Contact with and (suspected) exposure to covid-19: Secondary | ICD-10-CM

## 2019-10-29 DIAGNOSIS — J019 Acute sinusitis, unspecified: Secondary | ICD-10-CM | POA: Diagnosis not present

## 2019-10-29 DIAGNOSIS — R059 Cough, unspecified: Secondary | ICD-10-CM

## 2019-10-29 MED ORDER — FLUTICASONE PROPIONATE 50 MCG/ACT NA SUSP: 2.0000 | Freq: Every day | NASAL | 0 refills | Status: DC

## 2019-10-29 MED ORDER — BENZONATATE 100 MG PO CAPS: 100.0000 mg | ORAL_CAPSULE | Freq: Three times a day (TID) | ORAL | 0 refills | Status: DC | PRN

## 2019-10-29 MED ORDER — AMOXICILLIN-POT CLAVULANATE 875-125 MG PO TABS: 1.0000 | ORAL_TABLET | Freq: Two times a day (BID) | ORAL | 0 refills | Status: DC

## 2019-10-29 NOTE — Progress Notes (Signed)
We are sorry that you are not feeling well.  Here is how we plan to help!  Based on what you have shared with me it looks like you have sinusitis.  Please see the below information about how we are going to help treat this along with an antibiotic.  Additionally, given that your cough is worse than usual I have some concern that you may have COVID-19.  Below the information about sinusitis is information about how to get tested for COVID-19 and symptoms to be on the look out for.   Sinusitis is inflammation and infection in the sinus cavities of the head.  Based on your presentation I believe you most likely have Acute Bacterial Sinusitis.  This is an infection caused by bacteria and is treated with antibiotics. I have prescribed Augmentin 875mg /125mg  one tablet twice daily with food, for 7 days. I will also prescribe Flonase. You may use an oral decongestant such as Mucinex D or if you have glaucoma or high blood pressure use plain Mucinex. Saline nasal spray help and can safely be used as often as needed for congestion.  If you develop worsening sinus pain, fever or notice severe headache and vision changes, or if symptoms are not better after completion of antibiotic, please schedule an appointment with a health care provider.    Sinus infections are not as easily transmitted as other respiratory infection, however we still recommend that you avoid close contact with loved ones, especially the very young and elderly.  Remember to wash your hands thoroughly throughout the day as this is the number one way to prevent the spread of infection!  Home Care:  Only take medications as instructed by your medical team.  Complete the entire course of an antibiotic.  Do not take these medications with alcohol.  A steam or ultrasonic humidifier can help congestion.  You can place a towel over your head and breathe in the steam from hot water coming from a faucet.  Avoid close contacts especially the very  young and the elderly.  Cover your mouth when you cough or sneeze.  Always remember to wash your hands.  Get Help Right Away If:  You develop worsening fever or sinus pain.  You develop a severe head ache or visual changes.  Your symptoms persist after you have completed your treatment plan.   Testing Information: The COVID-19 Community Testing sites will begin testing BY APPOINTMENT ONLY.  You can schedule online at [email or web address]  If you do not have access to a smart phone or computer you may call [phone] for an appointment.   Additional testing sites in the Community:  . For [facility] Testing sites in [facility]  FaceUpdate.[email or web address]  . For Pop-up testing sites in [place]  BowlDirectory.co.uk  . For Testing sites with regular hours [email or web address].[email or web address]  . For [place] [email or web address]  . For [facility] [email or web address]  . For [facility] testing in [place] and [employer] [email or web address]  . For [facility] testing in [facility]   [email or web address]  For  more information about community testing call [phone]   Please quarantine yourself while awaiting your test results. Please stay home for a minimum of 10 days from the first day of illness with improving symptoms and you have had 24 hours of no fever (without the use of Tylenol (Acetaminophen) Motrin (Ibuprofen) or any fever reducing medication).  Also - Do not get tested prior to returning to work because once you have had  a positive test the test can stay positive for more then a month in some cases.    You should wear a mask or cloth face covering over your nose and mouth if you must be around other people or animals, including pets (even at home). Try to stay at least 6 feet away from other people. This will protect the people around you.  Please continue good preventive care measures, including:  frequent hand-washing, avoid touching your face, cover coughs/sneezes, stay out of crowds and keep a 6 foot distance from others.  COVID-19 is a respiratory illness with symptoms that are similar to the flu. Symptoms are typically mild to moderate, but there have been cases of severe illness and death due to the virus.   The following symptoms may appear 2-14 days after exposure: . Fever . Cough . Shortness of breath or difficulty breathing . Chills . Repeated shaking with chills . Muscle pain . Headache . Sore throat . New loss of taste or smell . Fatigue . Congestion or runny nose . Nausea or vomiting . Diarrhea  Go to the nearest hospital ED for assessment if fever/cough/breathlessness are severe or illness seems like a threat to life.  It is vitally important that if you feel that you have an infection such as this virus or any other virus that you stay home and away from places where you may spread it to others.  You should avoid contact with people age 13 and older.   You can use medication such as Best boy.   You may also take acetaminophen (Tylenol) as needed for fever.  Reduce your risk of any infection by using the same precautions used for avoiding the common cold or flu:  Marland Kitchen Wash your hands often with soap and warm water for at least 20 seconds.  If soap and water are not readily available, use an alcohol-based hand sanitizer with at least 60% alcohol.  . If coughing or sneezing, cover your mouth and nose by coughing or sneezing into the elbow areas of your shirt or coat, into a tissue or into your sleeve (not your hands). . Avoid shaking hands with others and consider  head nods or verbal greetings only. . Avoid touching your eyes, nose, or mouth with unwashed hands.  . Avoid close contact with people who are sick. . Avoid places or events with large numbers of people in one location, like concerts or sporting events. . Carefully consider travel plans you have or are making. . If you are planning any travel outside or inside the [place], visit the CDC's Travelers' Health webpage for the latest health notices. . If you have some symptoms but not all symptoms, continue to monitor at home and seek medical attention if your symptoms worsen. . If you are having a medical emergency, call 911.  HOME CARE . Only take medications as instructed by your medical team. . Drink plenty of fluids and get plenty of rest. . A steam or ultrasonic humidifier can help if you have congestion.   GET HELP RIGHT AWAY IF YOU HAVE EMERGENCY WARNING SIGNS** FOR COVID-19. If you or someone is showing any of these signs seek emergency medical care immediately. Call 911 or proceed to your closest emergency facility if: . You develop worsening high fever. . Trouble breathing . Bluish lips or face . Persistent pain or pressure in the chest . New confusion . Inability to wake or stay awake . You cough up blood. . Your symptoms become  more severe  **This list is not all possible symptoms. Contact your medical provider for any symptoms that are sever or concerning to you.  MAKE SURE YOU   Understand these instructions.  Will watch your condition.  Will get help right away if you are not doing well or get worse.   Your e-visit answers were reviewed by a board certified advanced clinical practitioner to complete your personal care plan.  Depending on the condition, your plan could have included both over the counter or prescription medications.  If there is a problem please reply  once you have received a response from your provider.  Your safety is important to [place].  If you have drug  allergies check your prescription carefully.    You can use MyChart to ask questions about today's visit, request a non-urgent call back, or ask for a work or school excuse for 24 hours related to this e-Visit. If it has been greater than 24 hours you will need to follow up with your provider, or enter a new e-Visit to address those concerns.  You will get an e-mail in the next two days asking about your experience.  I hope that your e-visit has been valuable and will speed your recovery. Thank you for using e-visits.  Greater than 5 minutes, yet less than 10 minutes of time have been spent researching, coordinating, and implementing care for this patient today

## 2019-10-30 ENCOUNTER — Ambulatory Visit: Payer: 59 | Attending: Internal Medicine

## 2019-10-30 DIAGNOSIS — Z20822 Contact with and (suspected) exposure to covid-19: Secondary | ICD-10-CM

## 2019-10-31 LAB — NOVEL CORONAVIRUS, NAA: SARS-CoV-2, NAA: NOT DETECTED

## 2020-09-21 ENCOUNTER — Emergency Department: Payer: 59

## 2020-09-21 ENCOUNTER — Other Ambulatory Visit: Payer: Self-pay

## 2020-09-21 ENCOUNTER — Encounter: Payer: Self-pay | Admitting: Emergency Medicine

## 2020-09-21 ENCOUNTER — Emergency Department
Admission: EM | Admit: 2020-09-21 | Discharge: 2020-09-21 | Disposition: A | Discharge status: Home / Self Care | Payer: 59 | Attending: Emergency Medicine | Admitting: Emergency Medicine

## 2020-09-21 DIAGNOSIS — I10 Essential (primary) hypertension: Secondary | ICD-10-CM | POA: Insufficient documentation

## 2020-09-21 DIAGNOSIS — E860 Dehydration: Secondary | ICD-10-CM

## 2020-09-21 DIAGNOSIS — U071 COVID-19: Secondary | ICD-10-CM | POA: Diagnosis not present

## 2020-09-21 DIAGNOSIS — E039 Hypothyroidism, unspecified: Secondary | ICD-10-CM | POA: Insufficient documentation

## 2020-09-21 DIAGNOSIS — R0789 Other chest pain: Secondary | ICD-10-CM | POA: Diagnosis present

## 2020-09-21 HISTORY — DX: Prediabetes: R73.03

## 2020-09-21 HISTORY — DX: Essential (primary) hypertension: I10

## 2020-09-21 HISTORY — DX: Disorder of thyroid, unspecified: E07.9

## 2020-09-21 LAB — BASIC METABOLIC PANEL
Anion gap: 9 (ref 5–15)
BUN: 17 mg/dL (ref 6–20)
CO2: 27 mmol/L (ref 22–32)
Calcium: 8.5 mg/dL — ABNORMAL LOW (ref 8.9–10.3)
Chloride: 102 mmol/L (ref 98–111)
Creatinine, Ser: 0.48 mg/dL (ref 0.44–1.00)
GFR, Estimated: >60 mL/min (ref >60)
Glucose, Bld: 113 mg/dL — ABNORMAL HIGH (ref 70–99)
Potassium: 3.7 mmol/L (ref 3.5–5.1)
Sodium: 138 mmol/L (ref 135–145)

## 2020-09-21 LAB — CBC
HCT: 35 % — ABNORMAL LOW (ref 36.0–46.0)
Hemoglobin: 11.1 g/dL — ABNORMAL LOW (ref 12.0–15.0)
MCH: 29.4 pg (ref 26.0–34.0)
MCHC: 31.7 g/dL (ref 30.0–36.0)
MCV: 92.6 fL (ref 80.0–100.0)
Platelets: 179 10*3/uL (ref 150–400)
RBC: 3.78 MIL/uL — ABNORMAL LOW (ref 3.87–5.11)
RDW: 13.3 % (ref 11.5–15.5)
WBC: 6 10*3/uL (ref 4.0–10.5)
nRBC: 0 % (ref 0.0–0.2)

## 2020-09-21 LAB — TROPONIN I (HIGH SENSITIVITY)
Troponin I (High Sensitivity): 4 ng/L (ref <18)
Troponin I (High Sensitivity): 4 ng/L (ref <18)

## 2020-09-21 LAB — MAGNESIUM: Magnesium: 2.3 mg/dL (ref 1.7–2.4)

## 2020-09-21 LAB — FIBRIN DERIVATIVES D-DIMER (ARMC ONLY): Fibrin derivatives D-dimer (ARMC): 1051.69 ng/mL (FEU) — ABNORMAL HIGH (ref 0.00–499.00)

## 2020-09-21 MED ORDER — ONDANSETRON HCL 4 MG PO TABS: 4.0000 mg | ORAL_TABLET | Freq: Three times a day (TID) | ORAL | 0 refills | Status: DC | PRN

## 2020-09-21 MED ORDER — IOHEXOL 350 MG/ML SOLN
100.0000 mL | Freq: Once | INTRAVENOUS | Status: AC | PRN
  Administered 2020-09-21: 17:00:00 100 mL via INTRAVENOUS
  Filled 2020-09-21: qty 100

## 2020-09-21 MED ORDER — ONDANSETRON HCL 4 MG PO TABS
4.0000 mg | ORAL_TABLET | Freq: Once | ORAL | Status: AC
  Administered 2020-09-21: 4 mg via ORAL
  Filled 2020-09-21: qty 1

## 2020-09-21 MED ORDER — LACTATED RINGERS IV BOLUS
1000.0000 mL | Freq: Once | INTRAVENOUS | Status: AC
  Administered 2020-09-21: 1000 mL via INTRAVENOUS

## 2020-09-21 NOTE — ED Notes (Signed)
PT ambulatory to restroom

## 2020-09-21 NOTE — ED Triage Notes (Signed)
First Nurse Note;  Tested positive for Covid one week ago.  Presents today with c/o painful cough and not feeling better.  AAOx3.  Skin warm and dry. NAD.  No SOB/ DOE.

## 2020-09-21 NOTE — ED Triage Notes (Signed)
See first RN Note; pt c/o symptoms x 3 weeks, states tested positive 1 week ago for covid. Pt c/o generalized chest tightness at this time. Pt able to speak in full and complete sentences at this time.

## 2020-09-21 NOTE — ED Provider Notes (Signed)
[facility] Emergency Department Provider Note  ____________________________________________   Event Date/Time   First MD Initiated Contact with Patient 09/21/20 1417     (approximate)  I have reviewed the triage vital signs and the nursing notes.   HISTORY  Chief Complaint Chest Pain, Cough, Headache, and Covid Positive   HPI Shelby Clarke is a 54 y.o. female with past medical history of HTN, prediabetes, and hypothyroidism as well as recent diagnosis of COVID-19 on 12/9 who presents for assessment of approximately 2 weeks of worsening fatigue, shortness of breath, coughing, chest tightness, poor p.o. intake, nausea and diarrhea.  Patient states she was initially placed on a short course of antibiotics by her PCP and a short course of prednisone but these have not significantly helped.  She denies any hemoptysis, vomiting, urinary symptoms, rash, focal extremity pain, recent falls or injuries or other acute complaints.  She states she did receive her first dose of Covid vaccine.  She denies tobacco abuse, EtOH use or illicit drug use.  She notes multiple members at home also have been diagnosed with COVID-19.         Past Medical History:  Diagnosis Date   Hypertension    Pre-diabetes    Thyroid disease     There are no problems to display for this patient.   Past Surgical History:  Procedure Laterality Date   FOOT SURGERY      Prior to Admission medications   Medication Sig Start Date End Date Taking? Authorizing Provider  amoxicillin-clavulanate (AUGMENTIN) 875-125 MG tablet Take 1 tablet by mouth 2 (two) times daily. One po bid x 7 days 10/29/19   Muthersbaugh, Jarrett Soho, PA-C  benzonatate (TESSALON PERLES) 100 MG capsule Take 1 capsule (100 mg total) by mouth 3 (three) times daily as needed for cough (cough). 10/29/19   Muthersbaugh, Jarrett Soho, PA-C  fluticasone (FLONASE) 50 MCG/ACT nasal spray Place 2 sprays into both nostrils daily. 10/29/19    Muthersbaugh, Jarrett Soho, PA-C  HYDROcodone-acetaminophen (NORCO/VICODIN) 5-325 MG tablet Take 1 tablet by mouth every 4 (four) hours as needed. 02/02/19   Harvest Dark, MD  ondansetron (ZOFRAN) 4 MG tablet Take 1 tablet (4 mg total) by mouth every 8 (eight) hours as needed for up to 10 doses for nausea or vomiting. 09/21/20   Lucrezia Starch, MD  traMADol (ULTRAM) 50 MG tablet Take 1 tablet (50 mg total) by mouth every 6 (six) hours as needed. 02/02/19   Harvest Dark, MD    Allergies Patient has no known allergies.  No family history on file.  Social History Social History   Tobacco Use   Smoking status: Never Smoker   Smokeless tobacco: Never Used  Substance Use Topics   Alcohol use: Not Currently   Drug use: Not Currently    Review of Systems  Review of Systems  Constitutional: Positive for chills, fever and malaise/fatigue.  HENT: Negative for sore throat.   Eyes: Negative for pain.  Respiratory: Positive for cough and shortness of breath. Negative for stridor.   Cardiovascular: Positive for chest pain and palpitations.  Gastrointestinal: Positive for diarrhea and nausea. Negative for vomiting.  Genitourinary: Negative for dysuria.  Musculoskeletal: Negative for myalgias.  Skin: Negative for rash.  Neurological: Positive for headaches. Negative for seizures and loss of consciousness.  Psychiatric/Behavioral: Negative for suicidal ideas.  All other systems reviewed and are negative.     ____________________________________________   PHYSICAL EXAM:  VITAL SIGNS: ED Triage Vitals  Enc Vitals Group  BP 09/21/20 1202 (!) 149/87     Pulse Rate 09/21/20 1202 85     Resp 09/21/20 1202 (!) 24     Temp 09/21/20 1202 99.1 F (37.3 C)     Temp Source 09/21/20 1202 Oral     SpO2 09/21/20 1202 95 %     Weight 09/21/20 1203 (!) 305 lb (138.3 kg)     Height 09/21/20 1203 5\' 5"  (1.651 m)     Head Circumference --      Peak Flow --      Pain Score 09/21/20  1203 5     Pain Loc --      Pain Edu? --      Excl. in [place]? --    Vitals:   09/21/20 1202  BP: (!) 149/87  Pulse: 85  Resp: (!) 24  Temp: 99.1 F (37.3 C)  SpO2: 95%   Physical Exam Vitals and nursing note reviewed.  Constitutional:      General: She is not in acute distress.    Appearance: She is well-developed and well-nourished. She is obese.  HENT:     Head: Normocephalic and atraumatic.     Right Ear: External ear normal.     Left Ear: External ear normal.     Nose: Nose normal.     Mouth/Throat:     Mouth: Mucous membranes are dry.  Eyes:     Conjunctiva/sclera: Conjunctivae normal.  Cardiovascular:     Rate and Rhythm: Normal rate and regular rhythm.     Heart sounds: No murmur heard.   Pulmonary:     Effort: Tachypnea present. No respiratory distress.     Breath sounds: Normal breath sounds.  Abdominal:     Palpations: Abdomen is soft.     Tenderness: There is no abdominal tenderness.  Musculoskeletal:        General: No edema.     Cervical back: Neck supple.  Skin:    General: Skin is warm and dry.     Capillary Refill: Capillary refill takes 2 to 3 seconds.  Neurological:     Mental Status: She is alert and oriented to person, place, and time.  Psychiatric:        Mood and Affect: Mood and affect and mood normal.      ____________________________________________   LABS (all labs ordered are listed, but only abnormal results are displayed)  Labs Reviewed  BASIC METABOLIC PANEL - Abnormal; Notable for the following components:      Result Value   Glucose, Bld 113 (*)    Calcium 8.5 (*)    All other components within normal limits  CBC - Abnormal; Notable for the following components:   RBC 3.78 (*)    Hemoglobin 11.1 (*)    HCT 35.0 (*)    All other components within normal limits  FIBRIN DERIVATIVES D-DIMER ([facility] ONLY) - Abnormal; Notable for the following components:   Fibrin derivatives D-dimer ([facility]) 1,051.69 (*)    All other components  within normal limits  MAGNESIUM  TROPONIN I (HIGH SENSITIVITY)  TROPONIN I (HIGH SENSITIVITY)   ____________________________________________  EKG  Sinus rhythm with a ventricular rate of 83, normal axis, unremarkable intervals, no evidence of acute ischemia or significant underlying arrhythmia ____________________________________________  RADIOLOGY  ED MD interpretation: Bilateral multifocal pneumonia consistent with known COVID-19 infection.  Large effusion, overt edema, widened mediastinum, pneumothorax or other acute thoracic process.   Official radiology report(s): DG Chest 2 View  Result Date: 09/21/2020 CLINICAL  DATA:  Chest pain.  COVID-19 positive EXAM: CHEST - 2 VIEW COMPARISON:  October 27, 2013 FINDINGS: There is widespread airspace opacity bilaterally. Heart size and pulmonary vascularity are normal. No adenopathy. No bone lesions. No pneumothorax. IMPRESSION: Widespread airspace opacity bilaterally, likely due to atypical organism pneumonia. Cardiac silhouette normal. No adenopathy. Electronically Signed   By: Lowella Grip III M.D.   On: 09/21/2020 12:54   CT Angio Chest PE W and/or Wo Contrast  Result Date: 09/21/2020 CLINICAL DATA:  PE suspected.  Positive for COVID 1 week ago. EXAM: CT ANGIOGRAPHY CHEST WITH CONTRAST TECHNIQUE: Multidetector CT imaging of the chest was performed using the standard protocol during bolus administration of intravenous contrast. Multiplanar CT image reconstructions and MIPs were obtained to evaluate the vascular anatomy. CONTRAST:  149mL OMNIPAQUE IOHEXOL 350 MG/ML SOLN COMPARISON:  Chest radiograph from the same day FINDINGS: Cardiovascular: No evidence of acute pulmonary embolism to the level of the proximal segmental pulmonary arteries. Evaluation more distal vasculature is limited by respiratory motion. No evidence of pulmonary embolism. Normal heart size. No pericardial effusion. Mediastinum/Nodes: No enlarged mediastinal, hilar, or  axillary lymph nodes. Unremarkable thyroid gland. Lungs/Pleura: Multifocal bilateral ground-glass opacities with peripheral predominance. No pleural effusions. No pneumothorax. Upper Abdomen: No acute abnormality. Small hiatal hernia. Cholecystectomy. Musculoskeletal: No chest wall abnormality. No acute or significant osseous findings. Review of the MIP images confirms the above findings. IMPRESSION: 1. No evidence of acute pulmonary embolism to the level of the proximal segmental pulmonary arteries. Evaluation more distal vasculature is limited by respiratory motion. 2. Multifocal bilateral ground-glass opacities with peripheral predominance, compatible with COVID pneumonia given the clinical history. Electronically Signed   By: Margaretha Sheffield MD   On: 09/21/2020 16:43    ____________________________________________   PROCEDURES  Procedure(s) performed (including Critical Care):  .1-3 Lead EKG Interpretation Performed by: Lucrezia Starch, MD Authorized by: Lucrezia Starch, MD     Interpretation: normal     ECG rate assessment: normal     Rhythm: sinus rhythm     Ectopy: none     Conduction: normal       ____________________________________________   INITIAL IMPRESSION / ASSESSMENT AND PLAN / ED COURSE      Patient presents with [place] to history exam for assessment of constellation of above-noted symptoms in the setting of recent COVID-19 diagnosis.  On arrival patient is afebrile and hemodynamically stable although slightly tachypneic with a respiratory of 24 with an SPO2 of 95% on room air.  On exam she does appear dehydrated with dry mucous membranes and decreased cap refill.  Suspect constellation of symptoms is likely secondary to ongoing COVID-19 infection.  Low suspicion concerning for acute bacterial superinfection at this time given absence of fever elevated white blood cell count with known COVID-19 infection.  Chest x-ray shows no evidence of pneumothorax or acute volume  overload.  ECG and troponins x2 not consistent with ACS or myocarditis. CTA obtained elevated dimer shows no evidence of PE, significant pericardial effusion, dissection or other acute thoracic process aside from bilateral pneumonia.  Patient treated with IV fluids and Zofran for some mild dehydration.  She is tolerating p.o.  Given she has not hypoxic otherwise in significant distress with otherwise reassuring exam and work-up I believe she is safe for discharge with plan for continued outpatient evaluation management.  Rx written for Zofran.  Discharge stable condition.  Strict return cautions advised discussed.   ____________________________________________   FINAL CLINICAL IMPRESSION(S) / ED DIAGNOSES  Final diagnoses:  COVID-19  Dehydration    Medications  lactated ringers bolus 1,000 mL (1,000 mLs Intravenous New Bag/Given 09/21/20 1454)  ondansetron (ZOFRAN) tablet 4 mg (4 mg Oral Given 09/21/20 1444)  iohexol (OMNIPAQUE) 350 MG/ML injection 100 mL (100 mLs Intravenous Contrast Given 09/21/20 1632)     ED Discharge Orders         Ordered    ondansetron (ZOFRAN) 4 MG tablet  Every 8 hours PRN        09/21/20 1650           Note:  This document was prepared using Dragon voice recognition software and may include unintentional dictation errors.   Lucrezia Starch, MD 09/21/20 [phone]

## 2021-01-17 ENCOUNTER — Encounter: Payer: Self-pay | Admitting: *Deleted

## 2021-03-13 ENCOUNTER — Ambulatory Visit (INDEPENDENT_AMBULATORY_CARE_PROVIDER_SITE_OTHER): Payer: Self-pay | Admitting: Gastroenterology

## 2021-03-13 ENCOUNTER — Other Ambulatory Visit: Payer: Self-pay

## 2021-03-13 ENCOUNTER — Encounter: Payer: Self-pay | Admitting: Gastroenterology

## 2021-03-13 VITALS — BP 139/81 | HR 78 | Temp 98.3°F | Ht 66.0 in | Wt 305.4 lb

## 2021-03-13 DIAGNOSIS — R1013 Epigastric pain: Secondary | ICD-10-CM

## 2021-03-13 DIAGNOSIS — R140 Abdominal distension (gaseous): Secondary | ICD-10-CM

## 2021-03-13 DIAGNOSIS — K529 Noninfective gastroenteritis and colitis, unspecified: Secondary | ICD-10-CM

## 2021-03-13 MED ORDER — DICYCLOMINE HCL 10 MG PO CAPS: 10.0000 mg | ORAL_CAPSULE | Freq: Three times a day (TID) | ORAL | 0 refills | Status: DC

## 2021-03-13 MED ORDER — NA SULFATE-K SULFATE-MG SULF 17.5-3.13-1.6 GM/177ML PO SOLN
354.0000 mL | Freq: Once | ORAL | 0 refills | Status: AC
Start: 2021-03-13 — End: 2021-03-13

## 2021-03-13 NOTE — Progress Notes (Signed)
Cephas Darby, MD [address]  [place]  [place], [place] [address]  Main: [phone]  Fax: [phone]    Gastroenterology Consultation  Referring Provider:     Denton Lank, MD Primary Care Physician:  Laneta Simmers, NP (Inactive) Primary Gastroenterologist:  Dr. Cephas Darby Reason for Consultation: Chronic diarrhea, abdominal bloating        HPI:   Shelby Clarke is a 55 y.o. female referred by Dr. Laneta Simmers, NP (Inactive)  for consultation & management of chronic diarrhea and abdominal bloating.  Patient has history of metabolic syndrome, has been experiencing several bouts of nonbloody liquid to soft bowel movements for at least 4 to 5 years associated with abdominal bloating, upper abdominal discomfort.  Patient reports that her bowel movements are mostly postprandial, generally not associated with any rectal bleeding.  She did have episodes of fecal incontinence, not able to make it to the bathroom due to urgency.  She does have intermittent episodes of nocturnal diarrhea. She does have history of hypothyroidism, on Synthroid.  Patient does not smoke or drink alcohol.  She does report consumption of sweet tea and carbonated beverages few times a week.  She does not report abnormal stress in her life.  She does have history of heartburn, particularly nocturnal for which she started on Prilosec 20 mg daily about 2 months ago by her PCP which provides relief.  Patient had cholecystectomy more than 10 years ago  NSAIDs: None  Antiplts/Anticoagulants/Anti thrombotics: None  GI Procedures: None Patient denies family history of GI malignancy, celiac disease, IBD, pancreatic cancer  Past Medical History:  Diagnosis Date  . Hypertension   . Pre-diabetes   . Thyroid disease     Past Surgical History:  Procedure Laterality Date  . FOOT SURGERY      Current Outpatient Medications:  .  albuterol (VENTOLIN HFA) 108 (90 Base) MCG/ACT inhaler, , Disp:  , Rfl:  .  dicyclomine (BENTYL) 10 MG capsule, Take 1 capsule (10 mg total) by mouth 3 (three) times daily before meals. As needed, Disp: 30 capsule, Rfl: 0 .  fluticasone (FLONASE) 50 MCG/ACT nasal spray, Place 2 sprays into both nostrils daily., Disp: 9.9 g, Rfl: 0 .  HYDROcodone-acetaminophen (NORCO/VICODIN) 5-325 MG tablet, Take 1 tablet by mouth every 4 (four) hours as needed., Disp: 15 tablet, Rfl: 0 .  levothyroxine (SYNTHROID) 50 MCG tablet, TAKE 1 TABLET BY MOUTH ONCE DAILY FOR HYPOTHYROIDISM, Disp: , Rfl:  .  lisinopril (ZESTRIL) 5 MG tablet, Take 1 tablet by mouth daily., Disp: , Rfl:  .  meloxicam (MOBIC) 15 MG tablet, TAKE 1 TABLET BY MOUTH ONCE DAILY FOR PAIN, Disp: , Rfl:  .  metFORMIN (GLUCOPHAGE-XR) 500 MG 24 hr tablet, TAKE 1 TABLET BY MOUTH ONCE DAILY FOR PREDIABETES, Disp: , Rfl:  .  Na Sulfate-K Sulfate-Mg Sulf 17.5-3.13-1.6 GM/177ML SOLN, Take 354 mLs by mouth once for 1 dose., Disp: 354 mL, Rfl: 0 .  naproxen (NAPROSYN) 500 MG tablet, Take 500 mg by mouth 2 (two) times daily as needed., Disp: , Rfl:  .  omeprazole (PRILOSEC) 20 MG capsule, Take 1 capsule by mouth daily., Disp: , Rfl:  .  ondansetron (ZOFRAN) 4 MG tablet, Take 1 tablet (4 mg total) by mouth every 8 (eight) hours as needed for up to 10 doses for nausea or vomiting., Disp: 10 tablet, Rfl: 0 .  traMADol (ULTRAM) 50 MG tablet, Take 1 tablet (50 mg total) by mouth every 6 (six) hours as  needed., Disp: 15 tablet, Rfl: 0 .  traZODone (DESYREL) 50 MG tablet, TAKE 1 TABLET BY MOUTH NIGHTLY AT BEDTIME, Disp: , Rfl:   History reviewed. No pertinent family history.   Social History   Tobacco Use  . Smoking status: Never Smoker  . Smokeless tobacco: Never Used  Substance Use Topics  . Alcohol use: Not Currently  . Drug use: Not Currently    Allergies as of 03/13/2021  . (No Known Allergies)    Review of Systems:    All systems reviewed and negative except where noted in HPI.   Physical Exam:  BP 139/81 (BP  Location: Left Arm, Patient Position: Sitting, Cuff Size: Large)   Pulse 78   Temp 98.3 F (36.8 C) (Oral)   Ht 5\' 6"  (1.676 m)   Wt (!) 305 lb 6 oz (138.5 kg)   BMI 49.29 kg/m  No LMP recorded. Patient is postmenopausal.  General:   Alert,  Well-developed, well-nourished, pleasant and cooperative in NAD Head:  Normocephalic and atraumatic. Eyes:  Sclera clear, no icterus.   Conjunctiva pink. Ears:  Normal auditory acuity. Nose:  No deformity, discharge, or lesions. Mouth:  No deformity or lesions,oropharynx pink & moist. Neck:  Supple; no masses or thyromegaly. Lungs:  Respirations even and unlabored.  Clear throughout to auscultation.   No wheezes, crackles, or rhonchi. No acute distress. Heart:  Regular rate and rhythm; no murmurs, clicks, rubs, or gallops. Abdomen:  Normal bowel sounds. Soft, mild epigastric tenderness, diffusely moderately distended without masses, hepatosplenomegaly or hernias noted.  No guarding or rebound tenderness.   Rectal: Not performed Msk:  Symmetrical without gross deformities. Good, equal movement & strength bilaterally. Pulses:  Normal pulses noted. Extremities:  No clubbing or edema.  No cyanosis. Neurologic:  Alert and oriented x3;  grossly normal neurologically. Skin:  Intact without significant lesions or rashes. No jaundice. Psych:  Alert and cooperative. Normal mood and affect.  Imaging Studies: Reviewed  Assessment and Plan:   Shelby Clarke is a 55 y.o. female with metabolic syndrome, hypothyroidism is seen in consultation for chronic symptoms of nonbloody diarrhea, postprandial urgency, abdominal bloating and cramps.  History of nocturnal GERD, currently maintained on low-dose omeprazole  Chronic nonbloody diarrhea Recommend upper endoscopy with gastric and duodenal biopsies Recommend colonoscopy with possible TI evaluation and random colon biopsies Check pancreatic fecal elastase levels Trial of Bentyl 10 mg as needed before each  meal and at bedtime Eliminate regular consumption of artificial sweeteners, red meat, carbonated beverages   Follow up in 2 to 3 months   Cephas Darby, MD

## 2021-03-14 ENCOUNTER — Telehealth: Payer: Self-pay

## 2021-03-14 NOTE — Telephone Encounter (Signed)
Patient dropped off stool sample today and states she forgot to tell you she has had hemorrhoids since her first baby. She states she has a little a rectal bleeding when they flair up

## 2021-03-16 ENCOUNTER — Encounter: Payer: Self-pay | Admitting: Gastroenterology

## 2021-03-16 LAB — PANCREATIC ELASTASE, FECAL: Pancreatic Elastase, Fecal: 470 ug Elast./g (ref >200)

## 2021-03-24 ENCOUNTER — Encounter: Payer: Self-pay | Admitting: Gastroenterology

## 2021-03-27 ENCOUNTER — Ambulatory Visit
Admission: RE | Admit: 2021-03-27 | Discharge: 2021-03-27 | Disposition: A | Discharge status: Home / Self Care | Payer: BC Managed Care – PPO | Attending: Gastroenterology | Admitting: Gastroenterology

## 2021-03-27 ENCOUNTER — Encounter: Payer: Self-pay | Admitting: Gastroenterology

## 2021-03-27 ENCOUNTER — Ambulatory Visit: Payer: BC Managed Care – PPO | Admitting: Anesthesiology

## 2021-03-27 ENCOUNTER — Encounter
Admission: RE | Disposition: A | Discharge status: Home / Self Care | Payer: Self-pay | Source: Home / Self Care | Attending: Gastroenterology

## 2021-03-27 DIAGNOSIS — Z791 Long term (current) use of non-steroidal anti-inflammatories (NSAID): Secondary | ICD-10-CM | POA: Diagnosis not present

## 2021-03-27 DIAGNOSIS — Z7984 Long term (current) use of oral hypoglycemic drugs: Secondary | ICD-10-CM | POA: Insufficient documentation

## 2021-03-27 DIAGNOSIS — K529 Noninfective gastroenteritis and colitis, unspecified: Secondary | ICD-10-CM | POA: Diagnosis not present

## 2021-03-27 DIAGNOSIS — R1013 Epigastric pain: Secondary | ICD-10-CM

## 2021-03-27 DIAGNOSIS — Z79899 Other long term (current) drug therapy: Secondary | ICD-10-CM | POA: Diagnosis not present

## 2021-03-27 DIAGNOSIS — K2950 Unspecified chronic gastritis without bleeding: Secondary | ICD-10-CM | POA: Diagnosis not present

## 2021-03-27 DIAGNOSIS — K635 Polyp of colon: Secondary | ICD-10-CM | POA: Diagnosis not present

## 2021-03-27 DIAGNOSIS — D120 Benign neoplasm of cecum: Secondary | ICD-10-CM | POA: Diagnosis not present

## 2021-03-27 DIAGNOSIS — Z79890 Hormone replacement therapy (postmenopausal): Secondary | ICD-10-CM | POA: Diagnosis not present

## 2021-03-27 DIAGNOSIS — K2980 Duodenitis without bleeding: Secondary | ICD-10-CM | POA: Diagnosis not present

## 2021-03-27 DIAGNOSIS — K621 Rectal polyp: Secondary | ICD-10-CM | POA: Insufficient documentation

## 2021-03-27 HISTORY — PX: COLONOSCOPY WITH PROPOFOL: SHX5780

## 2021-03-27 HISTORY — PX: ESOPHAGOGASTRODUODENOSCOPY (EGD) WITH PROPOFOL: SHX5813

## 2021-03-27 SURGERY — COLONOSCOPY WITH PROPOFOL
Anesthesia: General

## 2021-03-27 MED ORDER — PROPOFOL 10 MG/ML IV BOLUS
INTRAVENOUS | Status: DC | PRN
  Administered 2021-03-27: 20 mg via INTRAVENOUS
  Administered 2021-03-27: 30 mg via INTRAVENOUS

## 2021-03-27 MED ORDER — LIDOCAINE HCL (PF) 2 % IJ SOLN
INTRAMUSCULAR | Status: AC
  Filled 2021-03-27: qty 2

## 2021-03-27 MED ORDER — PROPOFOL 500 MG/50ML IV EMUL
INTRAVENOUS | Status: DC | PRN
  Administered 2021-03-27: 50 ug/kg/min via INTRAVENOUS

## 2021-03-27 MED ORDER — SODIUM CHLORIDE 0.9 % IV SOLN: INTRAVENOUS | Status: DC

## 2021-03-27 MED ORDER — MIDAZOLAM HCL 2 MG/2ML IJ SOLN
INTRAMUSCULAR | Status: AC
  Filled 2021-03-27: qty 2

## 2021-03-27 MED ORDER — DEXMEDETOMIDINE (PRECEDEX) IN NS 20 MCG/5ML (4 MCG/ML) IV SYRINGE
PREFILLED_SYRINGE | INTRAVENOUS | Status: AC
  Filled 2021-03-27: qty 5

## 2021-03-27 MED ORDER — FENTANYL CITRATE (PF) 100 MCG/2ML IJ SOLN
INTRAMUSCULAR | Status: DC | PRN
  Administered 2021-03-27 (×2): 50 ug via INTRAVENOUS

## 2021-03-27 MED ORDER — LIDOCAINE HCL (CARDIAC) PF 100 MG/5ML IV SOSY
PREFILLED_SYRINGE | INTRAVENOUS | Status: DC | PRN
  Administered 2021-03-27: 40 mg via INTRAVENOUS

## 2021-03-27 MED ORDER — PROPOFOL 500 MG/50ML IV EMUL
INTRAVENOUS | Status: AC
  Filled 2021-03-27: qty 50

## 2021-03-27 MED ORDER — MIDAZOLAM HCL 2 MG/2ML IJ SOLN
INTRAMUSCULAR | Status: DC | PRN
  Administered 2021-03-27: 2 mg via INTRAVENOUS

## 2021-03-27 MED ORDER — DEXMEDETOMIDINE (PRECEDEX) IN NS 20 MCG/5ML (4 MCG/ML) IV SYRINGE
PREFILLED_SYRINGE | INTRAVENOUS | Status: DC | PRN
  Administered 2021-03-27: 8 ug via INTRAVENOUS
  Administered 2021-03-27: 12 ug via INTRAVENOUS

## 2021-03-27 MED ORDER — FENTANYL CITRATE (PF) 100 MCG/2ML IJ SOLN
INTRAMUSCULAR | Status: AC
  Filled 2021-03-27: qty 2

## 2021-03-27 NOTE — Transfer of Care (Signed)
Immediate Anesthesia Transfer of Care Note  Patient: Shelby Clarke  Procedure(s) Performed: COLONOSCOPY WITH PROPOFOL ESOPHAGOGASTRODUODENOSCOPY (EGD) WITH PROPOFOL  Patient Location: PACU  Anesthesia Type:General  Level of Consciousness: sedated  Airway & Oxygen Therapy: Patient Spontanous Breathing and Patient connected to nasal cannula oxygen  Post-op Assessment: Report given to RN and Post -op Vital signs reviewed and stable  Post vital signs: Reviewed and stable  Last Vitals:  Vitals Value Taken Time  BP    Temp    Pulse    Resp 25 03/27/21 0935  SpO2    Vitals shown include unvalidated device data.  Last Pain:  Vitals:   03/27/21 0842  TempSrc: Temporal  PainSc: 0-No pain         Complications: No notable events documented.

## 2021-03-27 NOTE — Anesthesia Preprocedure Evaluation (Signed)
Anesthesia Evaluation  Patient identified by MRN, date of birth, ID band Patient awake    Reviewed: Allergy & Precautions, NPO status , Patient's Chart, lab work & pertinent test results  Airway Mallampati: II  TM Distance: >3 FB Neck ROM: Full    Dental no notable dental hx.    Pulmonary neg pulmonary ROS,    Pulmonary exam normal        Cardiovascular hypertension, Pt. on medications negative cardio ROS Normal cardiovascular exam     Neuro/Psych negative neurological ROS  negative psych ROS   GI/Hepatic negative GI ROS, Neg liver ROS, Bowel prep,  Endo/Other  diabetes, Well Controlled, Oral Hypoglycemic AgentsHypothyroidism Morbid obesity  Renal/GU negative Renal ROS  negative genitourinary   Musculoskeletal negative musculoskeletal ROS (+)   Abdominal   Peds negative pediatric ROS (+)  Hematology negative hematology ROS (+)   Anesthesia Other Findings Hypertension    Pre-diabetes    Thyroid disease       Reproductive/Obstetrics negative OB ROS                            Anesthesia Physical Anesthesia Plan  ASA: 3  Anesthesia Plan: General   Post-op Pain Management:    Induction: Intravenous  PONV Risk Score and Plan: 3 and Propofol infusion and TIVA  Airway Management Planned: Natural Airway and Nasal Cannula  Additional Equipment:   Intra-op Plan:   Post-operative Plan:   Informed Consent: I have reviewed the patients History and Physical, chart, labs and discussed the procedure including the risks, benefits and alternatives for the proposed anesthesia with the patient or authorized representative who has indicated his/her understanding and acceptance.       Plan Discussed with: CRNA, Anesthesiologist and Surgeon  Anesthesia Plan Comments:         Anesthesia Quick Evaluation

## 2021-03-27 NOTE — Op Note (Signed)
[facility] Gastroenterology Patient Name: Shelby Clarke Procedure Date: 03/27/2021 8:59 AM MRN: [id number] Account #: [id number] Date of Birth: 1965-11-13 Admit Type: Outpatient Age: 55 Room: [facility] ENDO ROOM 1 Gender: Female Note Status: Finalized Procedure:             Colonoscopy Indications:           Chronic diarrhea, Clinically significant diarrhea of                         unexplained origin Providers:             Lin Landsman MD, MD Referring MD:          No Local Md, MD (Referring MD) Medicines:             General Anesthesia Complications:         No immediate complications. Estimated blood loss: None. Procedure:             Pre-Anesthesia Assessment:                        - Prior to the procedure, a History and Physical was                         performed, and patient medications and allergies were                         reviewed. The patient is competent. The risks and                         benefits of the procedure and the sedation options and                         risks were discussed with the patient. All questions                         were answered and informed consent was obtained.                         Patient identification and proposed procedure were                         verified by the physician, the nurse, the                         anesthesiologist, the anesthetist and the technician                         in the pre-procedure area in the procedure room in the                         endoscopy suite. Mental Status Examination: alert and                         oriented. Airway Examination: normal oropharyngeal                         airway and neck mobility. Respiratory Examination:  clear to auscultation. CV Examination: normal.                         Prophylactic Antibiotics: The patient does not require                         prophylactic antibiotics. Prior Anticoagulants: The                          patient has taken no previous anticoagulant or                         antiplatelet agents. ASA Grade Assessment: III - A                         patient with severe systemic disease. After reviewing                         the risks and benefits, the patient was deemed in                         satisfactory condition to undergo the procedure. The                         anesthesia plan was to use general anesthesia.                         Immediately prior to administration of medications,                         the patient was re-assessed for adequacy to receive                         sedatives. The heart rate, respiratory rate, oxygen                         saturations, blood pressure, adequacy of pulmonary                         ventilation, and response to care were monitored                         throughout the procedure. The physical status of the                         patient was re-assessed after the procedure.                        After obtaining informed consent, the colonoscope was                         passed under direct vision. Throughout the procedure,                         the patient's blood pressure, pulse, and oxygen                         saturations were monitored continuously. The  Colonoscope was introduced through the anus and                         advanced to the the terminal ileum, with                         identification of the appendiceal orifice and IC                         valve. The colonoscopy was performed without                         difficulty. The patient tolerated the procedure well.                         The quality of the bowel preparation was evaluated                         using the BBPS [facility] Bowel Preparation Scale) with                         scores of: Right Colon = 3, Transverse Colon = 3 and                         Left Colon = 3 (entire mucosa seen well with no                          residual staining, small fragments of stool or opaque                         liquid). The total BBPS score equals 9. Findings:      The perianal and digital rectal examinations were normal. Pertinent       negatives include normal sphincter tone and no palpable rectal lesions.      Two sessile polyps were found in the rectum and cecum. The polyps were 6       mm in size. These polyps were removed with a cold snare. Resection and       retrieval were complete.      Normal mucosa was found in the entire colon. Biopsies for histology were       taken with a cold forceps from the entire colon for evaluation of       microscopic colitis.      The retroflexed view of the distal rectum and anal verge was normal and       showed no anal or rectal abnormalities. Impression:            - Two 6 mm polyps in the rectum and in the cecum,                         removed with a cold snare. Resected and retrieved.                        - Normal mucosa in the entire examined colon. Biopsied.                        - The distal rectum and anal verge are normal on  retroflexion view. Recommendation:        - Discharge patient to home (with escort).                        - Resume previous diet today.                        - Continue present medications.                        - Await pathology results.                        - Return to my office as previously scheduled. Procedure Code(s):     --- Professional ---                        [phone], Colonoscopy, flexible; with removal of                         tumor(s), polyp(s), or other lesion(s) by snare                         technique                        [id number], 52, Colonoscopy, flexible; with biopsy, single                         or multiple Diagnosis Code(s):     --- Professional ---                        K62.1, Rectal polyp                        K63.5, Polyp of colon                        K52.9, Noninfective  gastroenteritis and colitis,                         unspecified                        R19.7, Diarrhea, unspecified CPT copyright 2019 American Medical Association. All rights reserved. The codes documented in this report are preliminary and upon coder review may  be revised to meet current compliance requirements. Dr. Ulyess Mort Lin Landsman MD, MD 03/27/2021 9:34:40 AM This report has been signed electronically. Number of Addenda: 0 Note Initiated On: 03/27/2021 8:59 AM Scope Withdrawal Time: 0 hours 12 minutes 57 seconds  Total Procedure Duration: 0 hours 14 minutes 16 seconds  Estimated Blood Loss:  Estimated blood loss: none.      [facility]

## 2021-03-27 NOTE — H&P (Signed)
Shelby Darby, MD [address]  [place]  [place], [place] [address]  Main: [phone]  Fax: [phone] Pager: [phone]  Primary Care Physician:  Pcp, No Primary Gastroenterologist:  Dr. Cephas Clarke  Pre-Procedure History & Physical: HPI:  Shelby Clarke is a 55 y.o. female is here for an endoscopy and colonoscopy.   Past Medical History:  Diagnosis Date   Hypertension    Pre-diabetes    Thyroid disease     Past Surgical History:  Procedure Laterality Date   FOOT SURGERY      Prior to Admission medications   Medication Sig Start Date End Date Taking? Authorizing Provider  albuterol (VENTOLIN HFA) 108 (90 Base) MCG/ACT inhaler  09/20/20  Yes [provider]  levothyroxine (SYNTHROID) 50 MCG tablet TAKE 1 TABLET BY MOUTH ONCE DAILY FOR HYPOTHYROIDISM 03/12/19  Yes [provider]  lisinopril (ZESTRIL) 5 MG tablet Take 1 tablet by mouth daily. 04/23/19  Yes [provider]  meloxicam (MOBIC) 15 MG tablet TAKE 1 TABLET BY MOUTH ONCE DAILY FOR PAIN 05/07/19  Yes [provider]  metFORMIN (GLUCOPHAGE-XR) 500 MG 24 hr tablet TAKE 1 TABLET BY MOUTH ONCE DAILY FOR PREDIABETES 04/17/19  Yes [provider]  omeprazole (PRILOSEC) 20 MG capsule Take 1 capsule by mouth daily. 01/09/21  Yes [provider]  ondansetron (ZOFRAN) 4 MG tablet Take 1 tablet (4 mg total) by mouth every 8 (eight) hours as needed for up to 10 doses for nausea or vomiting. 09/21/20  Yes Shelby Starch, MD  traMADol (ULTRAM) 50 MG tablet Take 1 tablet (50 mg total) by mouth every 6 (six) hours as needed. 02/02/19  Yes Shelby Dark, MD  traZODone (DESYREL) 50 MG tablet TAKE 1 TABLET BY MOUTH NIGHTLY AT BEDTIME 05/28/19  Yes [provider]  dicyclomine (BENTYL) 10 MG capsule Take 1 capsule (10 mg total) by mouth 3 (three) times daily before meals. As needed 03/13/21   Shelby Landsman, MD  fluticasone [facility]) 50 MCG/ACT nasal spray  Place 2 sprays into both nostrils daily. Patient not taking: Reported on 03/27/2021 10/29/19   Shelby Clarke [place], PA-C  HYDROcodone-acetaminophen (NORCO/VICODIN) 5-325 MG tablet Take 1 tablet by mouth every 4 (four) hours as needed. Patient not taking: Reported on 03/27/2021 02/02/19   Shelby Dark, MD  naproxen (NAPROSYN) 500 MG tablet Take 500 mg by mouth 2 (two) times daily as needed. Patient not taking: Reported on 03/27/2021 02/10/21   [provider]    Allergies as of 03/13/2021   (No Known Allergies)    History reviewed. No pertinent family history.  Social History   Socioeconomic History   Marital status: Married    Spouse name: Not on file   Number of children: Not on file   Years of education: Not on file   Highest education level: Not on file  Occupational History   Not on file  Tobacco Use   Smoking status: Never   Smokeless tobacco: Never  Substance and Sexual Activity   Alcohol use: Not Currently   Drug use: Not Currently   Sexual activity: Not on file  Other Topics Concern   Not on file  Social History Narrative   Not on file   Social Determinants of Health   Financial Resource Strain: Not on file  Food Insecurity: Not on file  Transportation Needs: Not on file  Physical Activity: Not on file  Stress: Not on file  Social Connections: Not on file  Intimate  Partner Violence: Not on file    Review of Systems: See HPI, otherwise negative ROS  Physical Exam: BP (!) 158/79   Pulse 66   Temp (!) 97.2 F (36.2 C) (Temporal)   Resp 16   Ht 5\' 5"  (1.651 m)   Wt 136.1 kg   SpO2 99%   BMI 49.92 kg/m  General:   Alert,  pleasant and cooperative in NAD Head:  Normocephalic and atraumatic. Neck:  Supple; no masses or thyromegaly. Lungs:  Clear throughout to auscultation.    Heart:  Regular rate and rhythm. Abdomen:  Soft, nontender and nondistended. Normal bowel sounds, without guarding, and without rebound.   Neurologic:  Alert and   oriented x4;  grossly normal neurologically.  Impression/Plan: [name] is here for an endoscopy and colonoscopy to be performed for chronic diarrhea and epigastric pain  Risks, benefits, limitations, and alternatives regarding  endoscopy and colonoscopy have been reviewed with the patient.  Questions have been answered.  All parties agreeable.   Sherri Sear, MD  03/27/2021, 8:57 AM

## 2021-03-27 NOTE — Op Note (Signed)
[facility] Gastroenterology Patient Name: Shelby Clarke Procedure Date: 03/27/2021 9:00 AM MRN: [id number] Account #: [id number] Date of Birth: 04-Jul-1966 Admit Type: Outpatient Age: 55 Room: [facility] ENDO ROOM 1 Gender: Female Note Status: Finalized Procedure:             Upper GI endoscopy Indications:           Epigastric abdominal pain, Diarrhea Providers:             Shelby Landsman MD, MD Referring MD:          No Local Md, MD (Referring MD) Medicines:             General Anesthesia Complications:         No immediate complications. Estimated blood loss: None. Procedure:             Pre-Anesthesia Assessment:                        - Prior to the procedure, a History and Physical was                         performed, and patient medications and allergies were                         reviewed. The patient is competent. The risks and                         benefits of the procedure and the sedation options and                         risks were discussed with the patient. All questions                         were answered and informed consent was obtained.                         Patient identification and proposed procedure were                         verified by the physician, the nurse, the                         anesthesiologist, the anesthetist and the technician                         in the pre-procedure area in the procedure room in the                         endoscopy suite. Mental Status Examination: alert and                         oriented. Airway Examination: normal oropharyngeal                         airway and neck mobility. Respiratory Examination:                         clear to auscultation. CV Examination: normal.  Prophylactic Antibiotics: The patient does not require                         prophylactic antibiotics. Prior Anticoagulants: The                         patient has taken no previous  anticoagulant or                         antiplatelet agents. ASA Grade Assessment: III - A                         patient with severe systemic disease. After reviewing                         the risks and benefits, the patient was deemed in                         satisfactory condition to undergo the procedure. The                         anesthesia plan was to use general anesthesia.                         Immediately prior to administration of medications,                         the patient was re-assessed for adequacy to receive                         sedatives. The heart rate, respiratory rate, oxygen                         saturations, blood pressure, adequacy of pulmonary                         ventilation, and response to care were monitored                         throughout the procedure. The physical status of the                         patient was re-assessed after the procedure.                        After obtaining informed consent, the endoscope was                         passed under direct vision. Throughout the procedure,                         the patient's blood pressure, pulse, and oxygen                         saturations were monitored continuously. The Endoscope                         was introduced through the mouth, and advanced to the  second part of duodenum. The upper GI endoscopy was                         accomplished without difficulty. The patient tolerated                         the procedure well. Findings:      The duodenal bulb and second portion of the duodenum were normal.       Biopsies for histology were taken with a cold forceps for evaluation of       celiac disease.      The entire examined stomach was normal. Biopsies were taken with a cold       forceps for Helicobacter pylori testing.      Esophagogastric landmarks were identified: the gastroesophageal junction       was found at 32 cm from the  incisors.      The gastroesophageal junction and examined esophagus were normal. Impression:            - Normal duodenal bulb and second portion of the                         duodenum. Biopsied.                        - Normal stomach. Biopsied.                        - Esophagogastric landmarks identified.                        - Normal gastroesophageal junction and esophagus. Recommendation:        - Await pathology results.                        - Discharge patient to home (with escort).                        - Proceed with colonoscopy as scheduled                        See colonoscopy report Procedure Code(s):     --- Professional ---                        [phone], Esophagogastroduodenoscopy, flexible,                         transoral; with biopsy, single or multiple Diagnosis Code(s):     --- Professional ---                        R10.13, Epigastric pain                        R19.7, Diarrhea, unspecified CPT copyright 2019 American Medical Association. All rights reserved. The codes documented in this report are preliminary and upon coder review may  be revised to meet current compliance requirements. Dr. Ulyess Clarke Shelby Landsman MD, MD 03/27/2021 9:16:14 AM This report has been signed electronically. Number of Addenda: 0 Note Initiated On: 03/27/2021 9:00 AM Estimated Blood Loss:  Estimated blood loss: none.  [name]

## 2021-03-27 NOTE — Anesthesia Postprocedure Evaluation (Signed)
Anesthesia Post Note  Patient: Shelby Clarke  Procedure(s) Performed: COLONOSCOPY WITH PROPOFOL ESOPHAGOGASTRODUODENOSCOPY (EGD) WITH PROPOFOL  Patient location during evaluation: Phase II Anesthesia Type: General Level of consciousness: awake and alert, awake and oriented Pain management: pain level controlled Vital Signs Assessment: post-procedure vital signs reviewed and stable Respiratory status: spontaneous breathing, nonlabored ventilation and respiratory function stable Cardiovascular status: blood pressure returned to baseline and stable Postop Assessment: no apparent nausea or vomiting Anesthetic complications: no   No notable events documented.   Last Vitals:  Vitals:   03/27/21 0940 03/27/21 0950  BP: 113/61 133/88  Pulse: 76 67  Resp: (!) 30 15  Temp:    SpO2: 99% 100%    Last Pain:  Vitals:   03/27/21 0930  TempSrc: Temporal  PainSc:                  Phill Mutter

## 2021-03-28 ENCOUNTER — Encounter: Payer: Self-pay | Admitting: Gastroenterology

## 2021-03-28 LAB — SURGICAL PATHOLOGY

## 2021-05-18 ENCOUNTER — Other Ambulatory Visit: Payer: Self-pay | Admitting: Physician Assistant

## 2021-05-18 DIAGNOSIS — Z1231 Encounter for screening mammogram for malignant neoplasm of breast: Secondary | ICD-10-CM

## 2021-06-13 ENCOUNTER — Encounter: Payer: Self-pay | Admitting: Gastroenterology

## 2021-06-13 ENCOUNTER — Other Ambulatory Visit: Payer: Self-pay

## 2021-06-13 ENCOUNTER — Ambulatory Visit (INDEPENDENT_AMBULATORY_CARE_PROVIDER_SITE_OTHER): Payer: BC Managed Care – PPO | Admitting: Gastroenterology

## 2021-06-13 VITALS — BP 145/85 | HR 67 | Temp 97.9°F | Ht 66.0 in | Wt 302.0 lb

## 2021-06-13 DIAGNOSIS — K580 Irritable bowel syndrome with diarrhea: Secondary | ICD-10-CM

## 2021-06-13 MED ORDER — RIFAXIMIN 550 MG PO TABS: 550.0000 mg | ORAL_TABLET | Freq: Three times a day (TID) | ORAL | 0 refills | Status: AC

## 2021-06-13 NOTE — Progress Notes (Signed)
[place] [place]  [place]  [place], [place] [address]  Main: [phone]  Fax: [phone]    Gastroenterology Consultation  Referring Provider:     No ref. provider found Primary Care Physician:  Pcp, No Primary Gastroenterologist:  Dr. Cephas Darby Reason for Consultation: Chronic diarrhea, abdominal bloating        HPI:   Shelby Clarke is a 55 y.o. female referred by Dr. Merryl Hacker, No  for consultation & management of chronic diarrhea and abdominal bloating.  Patient has history of metabolic syndrome, has been experiencing several bouts of nonbloody liquid to soft bowel movements for at least 4 to 5 years associated with abdominal bloating, upper abdominal discomfort.  Patient reports that her bowel movements are mostly postprandial, generally not associated with any rectal bleeding.  She did have episodes of fecal incontinence, not able to make it to the bathroom due to urgency.  She does have intermittent episodes of nocturnal diarrhea. She does have history of hypothyroidism, on Synthroid.  Patient does not smoke or drink alcohol.  She does report consumption of sweet tea and carbonated beverages few times a week.  She does not report abnormal stress in her life.  She does have history of heartburn, particularly nocturnal for which she started on Prilosec 20 mg daily about 2 months ago by her PCP which provides relief.  Patient had cholecystectomy more than 10 years ago   Follow-up visit 06/13/2021 Patient is here for follow-up of chronic diarrhea.  She reports that she tried lactose-free diet for 2 to 3 weeks and noticed somewhat decrease in frequency of her bowel movements and less urgency.  She reports that she does not have accidents as before.  She also cut back on carbonated beverages.  She underwent EGD and colonoscopy which were all unremarkable.  Pancreatic fecal elastase levels were normal.  Bentyl 10 mg modestly helps with abdominal cramps  NSAIDs:  None  Antiplts/Anticoagulants/Anti thrombotics: None  GI Procedures: EGD and colonoscopy 03/27/2021  - Normal duodenal bulb and second portion of the duodenum. Biopsied. - Normal stomach. Biopsied. - Esophagogastric landmarks identified. - Normal gastroesophageal junction and esophagus.  - Two 6 mm polyps in the rectum and in the cecum, removed with a cold snare. Resected and retrieved. - Normal mucosa in the entire examined colon. Biopsied. - The distal rectum and anal verge are normal on retroflexion view.  DIAGNOSIS:  A. DUODENUM; COLD BIOPSY:  - PEPTIC DUODENITIS.  - NEGATIVE FOR FEATURES OF CELIAC DISEASE.  - NEGATIVE FOR DYSPLASIA AND MALIGNANCY.   B. STOMACH, RANDOM; COLD BIOPSY:  - CHRONIC GASTRITIS.  - NEGATIVE FOR ACTIVE INFLAMMATION AND H PYLORI.  - NEGATIVE FOR INTESTINAL METAPLASIA, DYSPLASIA, AND MALIGNANCY.   C. COLON POLYP, CECUM; COLD SNARE:  - SESSILE SERRATED POLYP.  - NEGATIVE FOR DYSPLASIA AND MALIGNANCY.   D. COLON, RANDOM; COLD BIOPSY:  - COLONIC MUCOSA WITH NO SIGNIFICANT PATHOLOGIC ALTERATION.  - NEGATIVE FOR MICROSCOPIC COLITIS, DYSPLASIA, AND MALIGNANCY.   E. RECTUM POLYP; COLD SNARE:  - FEATURES SUGGESTIVE OF HYPERPLASTIC POLYP.  - NEGATIVE FOR DYSPLASIA AND MALIGNANCY.  Patient denies family history of GI malignancy, celiac disease, IBD, pancreatic cancer  Past Medical History:  Diagnosis Date   Hypertension    Pre-diabetes    Thyroid disease     Past Surgical History:  Procedure Laterality Date   COLONOSCOPY WITH PROPOFOL N/A 03/27/2021   Procedure: COLONOSCOPY WITH PROPOFOL;  Surgeon: Lin Landsman, MD;  Location: [facility] ENDOSCOPY;  Service: Gastroenterology;  Laterality: N/A;   ESOPHAGOGASTRODUODENOSCOPY (EGD) WITH PROPOFOL N/A 03/27/2021   Procedure: ESOPHAGOGASTRODUODENOSCOPY (EGD) WITH PROPOFOL;  Surgeon: Lin Landsman, MD;  Location: [facility] ENDOSCOPY;  Service: Gastroenterology;  Laterality: N/A;   FOOT SURGERY       Current Outpatient Medications:    albuterol (VENTOLIN HFA) 108 (90 Base) MCG/ACT inhaler, , Disp: , Rfl:    diclofenac Sodium (VOLTAREN) 1 % GEL, SMARTSIG:2 Gram(s) Topical 3 Times Daily PRN, Disp: , Rfl:    dicyclomine (BENTYL) 10 MG capsule, Take 1 capsule (10 mg total) by mouth 3 (three) times daily before meals. As needed, Disp: 30 capsule, Rfl: 0   levothyroxine (SYNTHROID) 50 MCG tablet, TAKE 1 TABLET BY MOUTH ONCE DAILY FOR HYPOTHYROIDISM, Disp: , Rfl:    lisinopril (ZESTRIL) 5 MG tablet, Take 1 tablet by mouth daily., Disp: , Rfl:    meloxicam (MOBIC) 15 MG tablet, TAKE 1 TABLET BY MOUTH ONCE DAILY FOR PAIN, Disp: , Rfl:    metFORMIN (GLUCOPHAGE-XR) 500 MG 24 hr tablet, TAKE 1 TABLET BY MOUTH ONCE DAILY FOR PREDIABETES, Disp: , Rfl:    omeprazole (PRILOSEC) 20 MG capsule, Take 1 capsule by mouth daily., Disp: , Rfl:    ondansetron (ZOFRAN) 4 MG tablet, Take 1 tablet (4 mg total) by mouth every 8 (eight) hours as needed for up to 10 doses for nausea or vomiting., Disp: 10 tablet, Rfl: 0   rifaximin (XIFAXAN) 550 MG TABS tablet, Take 1 tablet (550 mg total) by mouth 3 (three) times daily for 14 days., Disp: 42 tablet, Rfl: 0   traMADol (ULTRAM) 50 MG tablet, Take 1 tablet (50 mg total) by mouth every 6 (six) hours as needed., Disp: 15 tablet, Rfl: 0   traZODone (DESYREL) 50 MG tablet, TAKE 1 TABLET BY MOUTH NIGHTLY AT BEDTIME, Disp: , Rfl:   History reviewed. No pertinent family history.   Social History   Tobacco Use   Smoking status: Never   Smokeless tobacco: Never  Substance Use Topics   Alcohol use: Not Currently   Drug use: Not Currently    Allergies as of 06/13/2021   (No Known Allergies)    Review of Systems:    All systems reviewed and negative except where noted in HPI.   Physical Exam:  BP (!) 145/85 (BP Location: Left Arm, Patient Position: Sitting, Cuff Size: Large)   Pulse 67   Temp 97.9 F (36.6 C) (Oral)   Ht '5\' 6"'$  (1.676 m)   Wt (!) 302 lb (137 kg)    BMI 48.74 kg/m  No LMP recorded. Patient is postmenopausal.  General:   Alert,  Well-developed, well-nourished, pleasant and cooperative in NAD Head:  Normocephalic and atraumatic. Eyes:  Sclera clear, no icterus.   Conjunctiva pink. Ears:  Normal auditory acuity. Nose:  No deformity, discharge, or lesions. Mouth:  No deformity or lesions,oropharynx pink & moist. Neck:  Supple; no masses or thyromegaly. Lungs:  Respirations even and unlabored.  Clear throughout to auscultation.   No wheezes, crackles, or rhonchi. No acute distress. Heart:  Regular rate and rhythm; no murmurs, clicks, rubs, or gallops. Abdomen:  Normal bowel sounds. Soft, mild epigastric tenderness, diffusely moderately distended without masses, hepatosplenomegaly or hernias noted.  No guarding or rebound tenderness.   Rectal: Not performed Msk:  Symmetrical without gross deformities. Good, equal movement & strength bilaterally. Pulses:  Normal pulses noted. Extremities:  No clubbing or edema.  No cyanosis. Neurologic:  Alert and oriented x3;  grossly normal neurologically. Skin:  Intact without significant lesions or rashes. No jaundice. Psych:  Alert and cooperative. Normal mood and affect.  Imaging Studies: Reviewed  Assessment and Plan:   Shelby Clarke is a 55 y.o. female with metabolic syndrome, hypothyroidism is seen in consultation for chronic symptoms of nonbloody diarrhea, postprandial urgency, abdominal bloating and cramps.  History of nocturnal GERD, currently maintained on low-dose omeprazole  Chronic nonbloody diarrhea: Without any other constitutional signs or symptoms Upper endoscopy and colonoscopy were unremarkable including biopsies no evidence of H. 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  If her symptoms are persistent, advised her to add lactobacillus along with lactose-free diet May perform stool studies to rule out infection Increase Bentyl to 20 mg before each meal Trial of cholestyramine or Viberzi in future if needed Reiterated to avoid regular consumption of artificial sweeteners, red meat, carbonated beverages   Follow up in 3-4 months   Cephas Darby, MD

## 2021-06-21 ENCOUNTER — Telehealth: Payer: Self-pay | Admitting: Gastroenterology

## 2021-06-21 ENCOUNTER — Ambulatory Visit
Admission: RE | Admit: 2021-06-21 | Discharge: 2021-06-21 | Disposition: A | Discharge status: Home / Self Care | Payer: BC Managed Care – PPO | Source: Ambulatory Visit | Attending: Physician Assistant | Admitting: Physician Assistant

## 2021-06-21 ENCOUNTER — Other Ambulatory Visit: Payer: Self-pay

## 2021-06-21 DIAGNOSIS — Z1231 Encounter for screening mammogram for malignant neoplasm of breast: Secondary | ICD-10-CM | POA: Insufficient documentation

## 2021-06-21 DIAGNOSIS — K580 Irritable bowel syndrome with diarrhea: Secondary | ICD-10-CM

## 2021-06-21 NOTE — Telephone Encounter (Signed)
Pt. Requesting a phone call about the antibiotic that was prescribed to her at her last vist. She said that the medicine is over 2400.00 and she can not afford it. She would like a call back to see if there is a cheaper medicine that she can take

## 2021-06-22 NOTE — Telephone Encounter (Signed)
Patient verbalized understanding of instructions and will come on Monday

## 2021-06-22 NOTE — Telephone Encounter (Signed)
Unfortunately, there is no alternative antibiotic at this time. Recommend stool studies to rule out infection.  If this is negative, recommend to start OTC lactobacillus probiotic 2 times a day  RV

## 2021-06-22 NOTE — Telephone Encounter (Signed)
Patient was given Xifaxan '550mg'$ 

## 2021-06-22 NOTE — Addendum Note (Signed)
Addended by: Ulyess Blossom L on: 06/22/2021 03:50 PM   Modules accepted: Orders

## 2021-06-28 ENCOUNTER — Inpatient Hospital Stay
Admission: RE | Admit: 2021-06-28 | Discharge: 2021-06-28 | Disposition: A | Discharge status: Home / Self Care | Payer: Self-pay | Source: Ambulatory Visit | Attending: *Deleted | Admitting: *Deleted

## 2021-06-28 ENCOUNTER — Other Ambulatory Visit: Payer: Self-pay | Admitting: *Deleted

## 2021-06-28 DIAGNOSIS — Z1231 Encounter for screening mammogram for malignant neoplasm of breast: Secondary | ICD-10-CM

## 2021-09-12 ENCOUNTER — Ambulatory Visit: Payer: BC Managed Care – PPO | Admitting: Gastroenterology

## 2021-09-18 ENCOUNTER — Encounter: Payer: Self-pay | Admitting: Gastroenterology

## 2021-12-12 IMAGING — MG MM DIGITAL SCREENING BILAT W/ TOMO AND CAD
8 series · 8 of 24 positions shown · non-contrast
Comparison: Previous exam(s).

CLINICAL DATA: Screening.

EXAM:
DIGITAL SCREENING BILATERAL MAMMOGRAM WITH TOMOSYNTHESIS AND CAD
TECHNIQUE: Bilateral screening digital craniocaudal and mediolateral oblique
mammograms were obtained. Bilateral screening digital breast
tomosynthesis was performed. The images were evaluated with
computer-aided detection.

[L CC synth-2D]
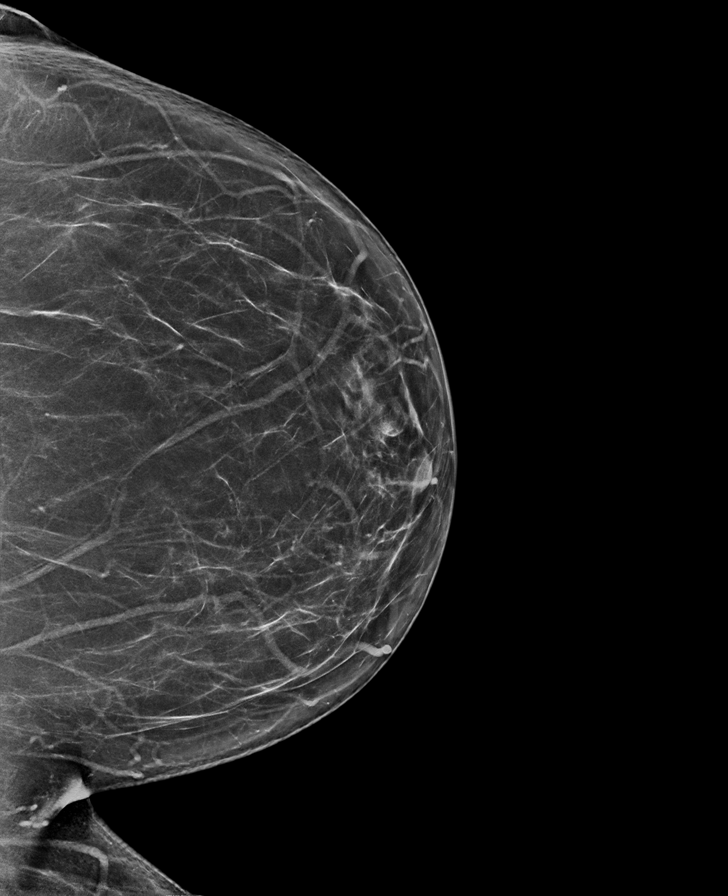

[R CC synth-2D]
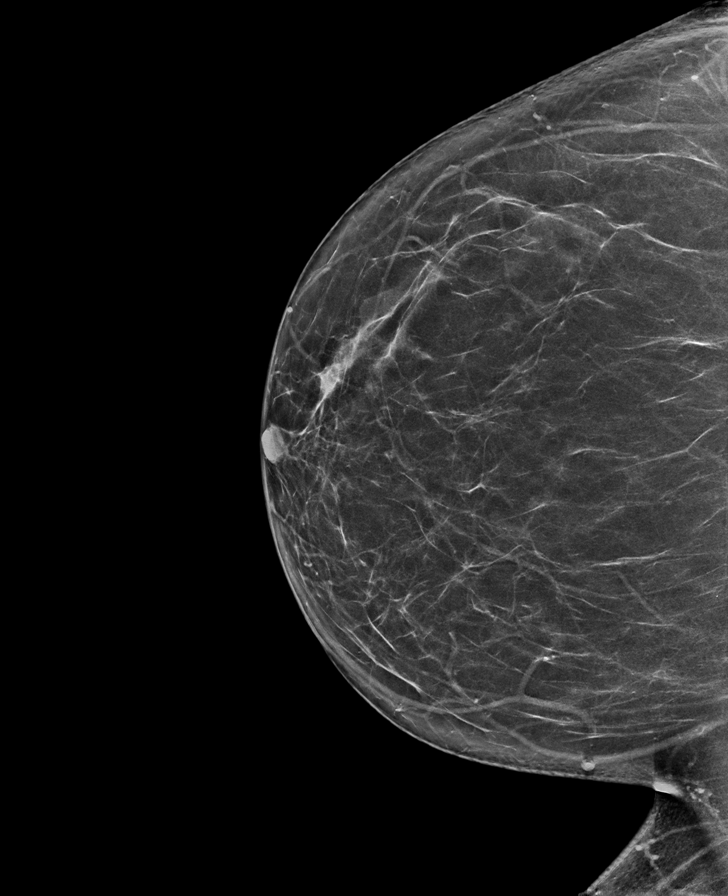

[L MLO synth-2D]
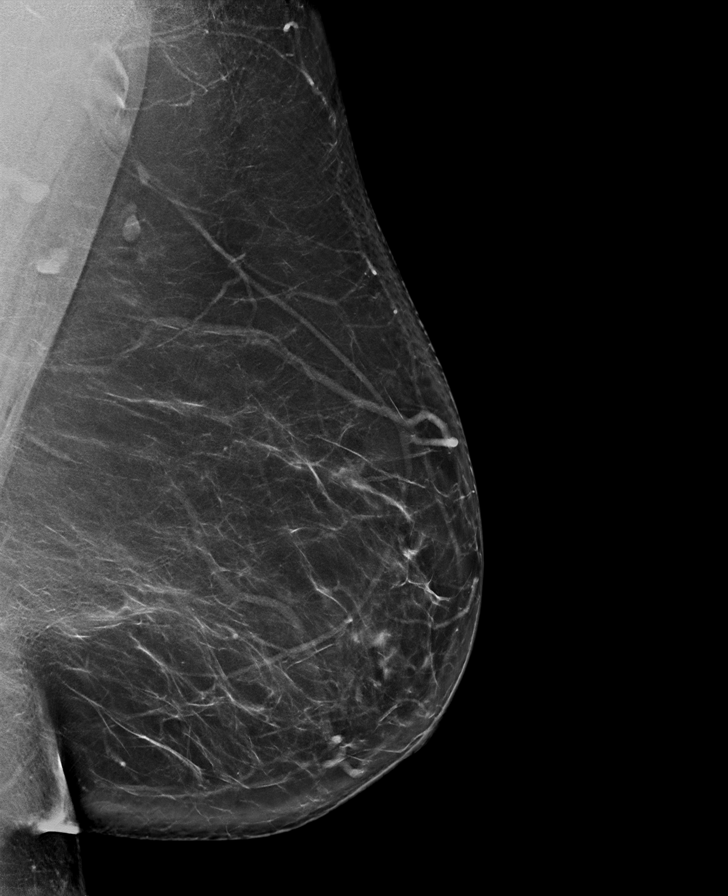

[R MLO synth-2D]
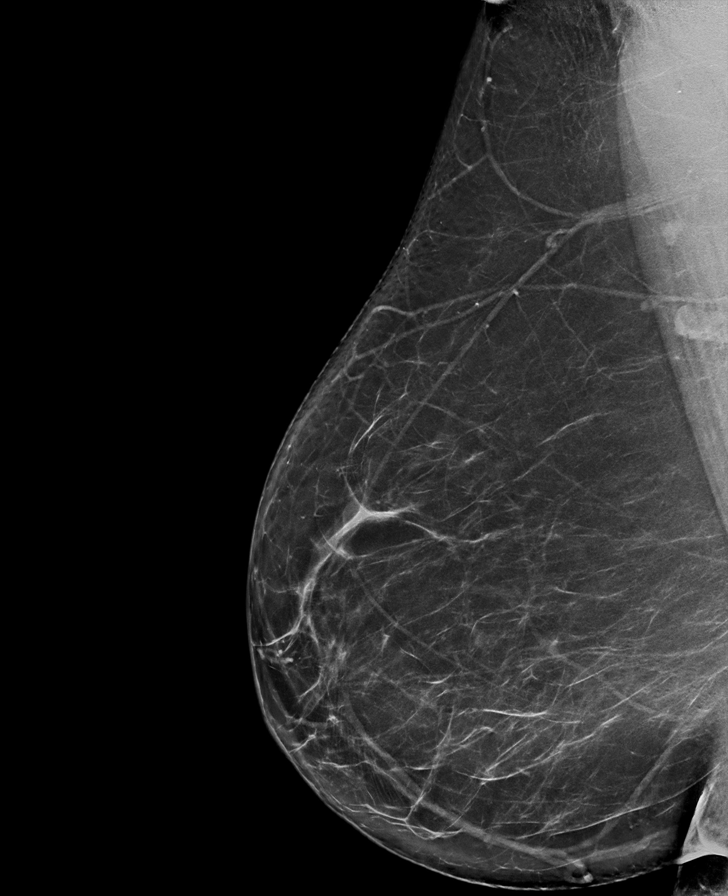

[R CC tomo · tomo slice 36/71.0]
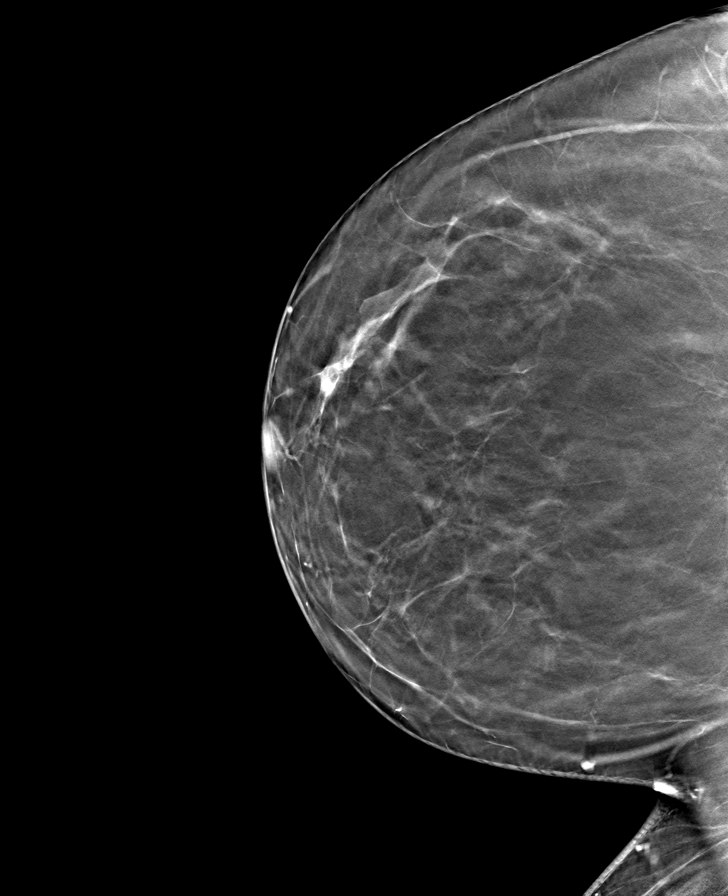

[R MLO tomo · tomo slice 41/82.0]
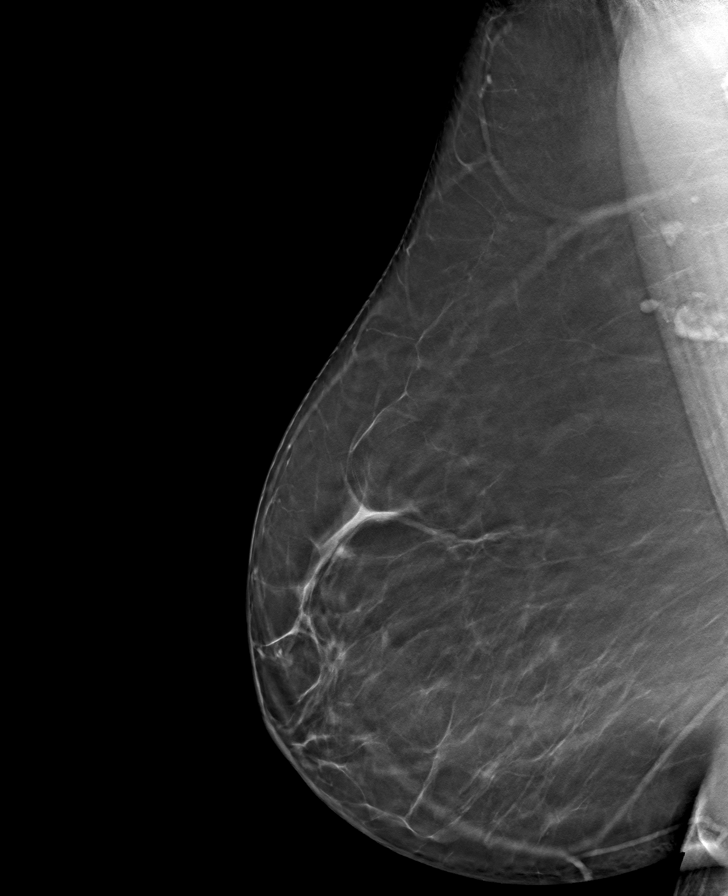

[L MLO tomo · tomo slice 44/87.0]
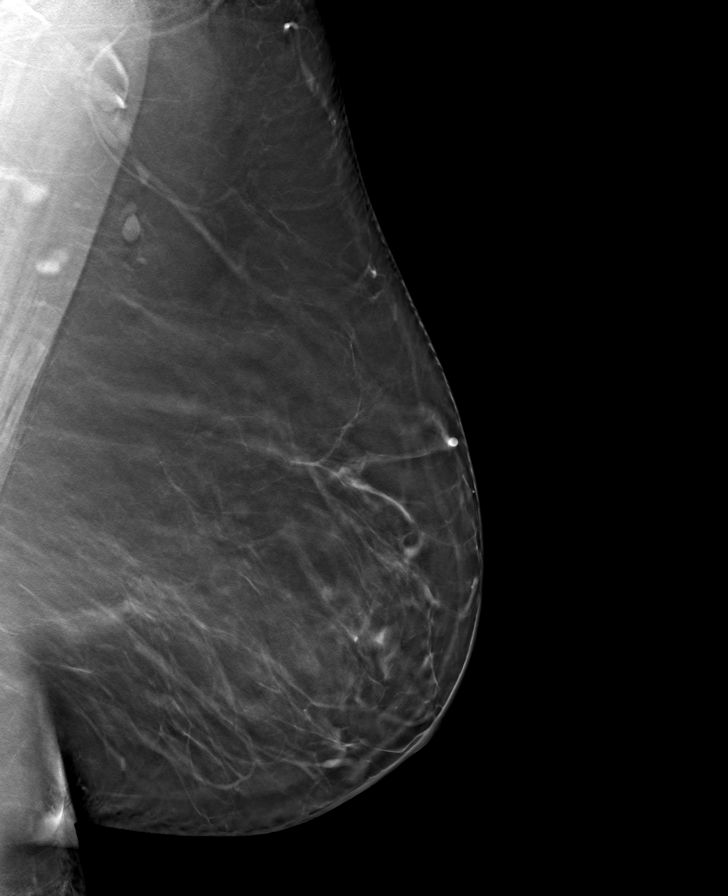

[L CC tomo · tomo slice 38/75.0]
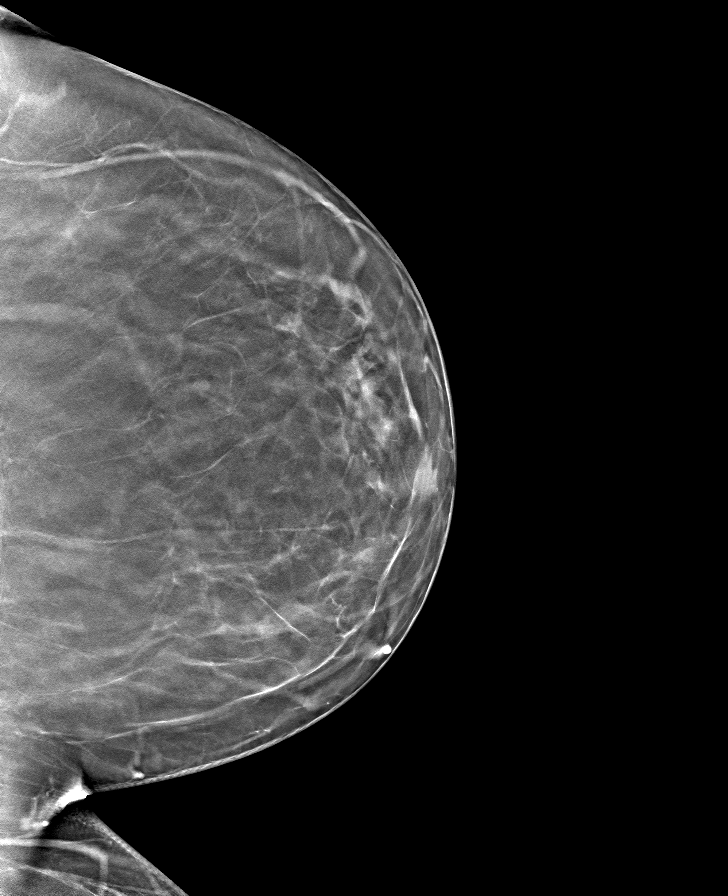

[8 of 24 positions shown; findings below may reference images not displayed]

ACR Breast Density Category b: There are scattered areas of
fibroglandular density.
FINDINGS: There are no findings suspicious for malignancy.
IMPRESSION: No mammographic evidence of malignancy. A result letter of this
screening mammogram will be mailed directly to the patient.

RECOMMENDATION:
Screening mammogram in one year. (Code:51-O-LD2)

BI-RADS CATEGORY  1: Negative.

## 2021-12-16 ENCOUNTER — Ambulatory Visit
Admission: RE | Admit: 2021-12-16 | Discharge: 2021-12-16 | Disposition: A | Discharge status: Home / Self Care | Payer: BC Managed Care – PPO | Source: Ambulatory Visit | Attending: Emergency Medicine | Admitting: Emergency Medicine

## 2021-12-16 ENCOUNTER — Other Ambulatory Visit: Payer: Self-pay

## 2021-12-16 VITALS — BP 153/70 | HR 94 | Temp 98.6°F | Resp 18

## 2021-12-16 DIAGNOSIS — R21 Rash and other nonspecific skin eruption: Secondary | ICD-10-CM | POA: Diagnosis not present

## 2021-12-16 DIAGNOSIS — B029 Zoster without complications: Secondary | ICD-10-CM

## 2021-12-16 MED ORDER — VALACYCLOVIR HCL 1 G PO TABS: 1000.0000 mg | ORAL_TABLET | Freq: Three times a day (TID) | ORAL | 0 refills | Status: DC

## 2021-12-16 NOTE — ED Provider Notes (Signed)
?[facility] ? ? ? ?CSN: [id number] ?Arrival date & time: 12/16/21  7867 ? ? ?  ? ?History   ?Chief Complaint ?Chief Complaint  ?Patient presents with  ? Rash  ? ? ?HPI ?Shelby Clarke is a 56 y.o. female.  Patient presents with painful blistering rash on her anterior left lower leg x 4 to 5 days.  Patient states this is similar to previous episode of shingles.  No open sores or drainage.  No fever, chills, numbness, weakness, or other symptoms.  No treatments at home.  Her medical history includes hypertension, prediabetes, thyroid disease, morbid obesity. ? ?The history is provided by the patient and medical records.  ? ?Past Medical History:  ?Diagnosis Date  ? Hypertension   ? Pre-diabetes   ? Thyroid disease   ? ? ?Patient Active Problem List  ? Diagnosis Date Noted  ? Chronic diarrhea   ? Incontinence of feces with fecal urgency 06/10/2019  ? 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 ? ?Past Surgical History:  ?Procedure Laterality Date  ? COLONOSCOPY WITH PROPOFOL N/A 03/27/2021  ? Procedure: COLONOSCOPY WITH PROPOFOL;  Surgeon: Lin Landsman, MD;  Location: [facility] ENDOSCOPY;  Service: Gastroenterology;  Laterality: N/A;  ? ESOPHAGOGASTRODUODENOSCOPY (EGD) WITH PROPOFOL N/A 03/27/2021  ? Procedure: ESOPHAGOGASTRODUODENOSCOPY (EGD) WITH PROPOFOL;  Surgeon: Lin Landsman, MD;  Location: [facility] ENDOSCOPY;  Service: Gastroenterology;  Laterality: N/A;  ? FOOT SURGERY    ? ? ?OB History   ?No obstetric history on file. ?  ? ? ? ?Home Medications   ? ?Prior to Admission medications   ?Medication Sig Start Date End Date Taking? Authorizing Provider  ?valACYclovir (VALTREX) 1000 MG tablet Take 1 tablet (1,000 mg total) by mouth every 8 (eight) hours. 12/16/21  Yes Sharion Balloon, NP  ?albuterol (VENTOLIN HFA) 108 (90 Base) MCG/ACT inhaler  09/20/20   [provider]  ?diclofenac Sodium (VOLTAREN) 1 % GEL SMARTSIG:2 Gram(s) Topical 3 Times Daily PRN 05/17/21   [provider]  ?dicyclomine (BENTYL) 10 MG capsule Take 1 capsule (10 mg total) by mouth 3 (three) times daily before meals. As needed 03/13/21   Lin Landsman, MD  ?levothyroxine (SYNTHROID) 50 MCG tablet TAKE 1 TABLET BY MOUTH ONCE DAILY FOR HYPOTHYROIDISM 03/12/19   [provider]  ?lisinopril (ZESTRIL) 5 MG tablet Take 1 tablet by mouth daily. 04/23/19   [provider]  ?meloxicam (MOBIC) 15 MG tablet TAKE 1 TABLET BY MOUTH ONCE DAILY FOR PAIN 05/07/19   [provider]  ?metFORMIN (GLUCOPHAGE-XR) 500 MG 24 hr tablet TAKE 1 TABLET BY MOUTH ONCE DAILY FOR PREDIABETES 04/17/19   [provider]  ?omeprazole (PRILOSEC) 20 MG capsule Take 1 capsule by mouth daily. 01/09/21   [provider]  ?ondansetron (ZOFRAN) 4 MG tablet Take 1 tablet (4 mg total) by mouth every 8 (eight) hours as needed for up to 10 doses for nausea or vomiting. 09/21/20   Lucrezia Starch, MD  ?traMADol (ULTRAM) 50 MG tablet Take 1 tablet (50 mg total) by mouth every 6 (six) hours as needed. 02/02/19   Harvest Dark, MD  ?traZODone (DESYREL) 50 MG tablet TAKE 1 TABLET BY MOUTH NIGHTLY AT BEDTIME 05/28/19   [provider]  ? ? ?Family History ?History reviewed. No pertinent family history. ? ?Social History ?Social History  ? ?Tobacco Use  ? Smoking status: Never  ? Smokeless tobacco: Never  ?Substance Use Topics  ? Alcohol use: Not Currently  ?  Drug use: Not Currently  ? ? ? ?Allergies   ?Patient has no known allergies. ? ? ?Review of Systems ?Review of Systems  ?Constitutional:  Negative for chills and fever.  ?Musculoskeletal:  Negative for arthralgias, gait problem and joint swelling.  ?Skin:  Positive for rash. Negative for color change.  ?Neurological:  Negative for weakness and numbness.  ?All other systems reviewed and are negative. ? ? ?Physical Exam ?Triage Vital Signs ?ED Triage Vitals  ?Enc Vitals Group  ?   BP   ?   Pulse   ?   Resp   ?   Temp   ?   Temp src   ?   SpO2   ?   Weight   ?    Height   ?   Head Circumference   ?   Peak Flow   ?   Pain Score   ?   Pain Loc   ?   Pain Edu?   ?   Excl. in [place]?   ? ?No data found. ? ?Updated Vital Signs ?BP (!) 153/70   Pulse 94   Temp 98.6 ?F (37 ?C)   Resp 18   SpO2 96%  ? ?Visual Acuity ?Right Eye Distance:   ?Left Eye Distance:   ?Bilateral Distance:   ? ?Right Eye Near:   ?Left Eye Near:    ?Bilateral Near:    ? ?Physical Exam ?Vitals and nursing note reviewed.  ?Constitutional:   ?   General: She is not in acute distress. ?   Appearance: She is well-developed. She is obese. She is not ill-appearing.  ?HENT:  ?   Mouth/Throat:  ?   Mouth: Mucous membranes are moist.  ?Cardiovascular:  ?   Rate and Rhythm: Normal rate and regular rhythm.  ?   Heart sounds: Normal heart sounds.  ?Pulmonary:  ?   Effort: Pulmonary effort is normal. No respiratory distress.  ?   Breath sounds: Normal breath sounds.  ?Abdominal:  ?   Palpations: Abdomen is soft.  ?   Tenderness: There is no abdominal tenderness.  ?Musculoskeletal:     ?   General: No swelling or deformity. Normal range of motion.  ?   Cervical back: Neck supple.  ?Skin: ?   General: Skin is warm and dry.  ?   Capillary Refill: Capillary refill takes less than 2 seconds.  ?   Findings: Rash present.  ?   Comments: Mildly tender, light pink vesicular and patchy rash on left lower leg. See picture.   ?Neurological:  ?   General: No focal deficit present.  ?   Mental Status: She is alert and oriented to person, place, and time.  ?   Sensory: No sensory deficit.  ?   Motor: No weakness.  ?   Gait: Gait normal.  ?Psychiatric:     ?   Mood and Affect: Mood normal.     ?   Behavior: Behavior normal.  ? ? ? ? ?UC Treatments / Results  ?Labs ?(all labs ordered are listed, but only abnormal results are displayed) ?Labs Reviewed - No data to display ? ?EKG ? ? ?Radiology ?No results found. ? ?Procedures ?Procedures (including critical care time) ? ?Medications Ordered in UC ?Medications - No data to  display ? ?Initial Impression / Assessment and Plan / UC Course  ?I have reviewed the triage vital signs and the nursing notes. ? ?Pertinent labs & imaging results that were available during my care  of the patient were reviewed by me and considered in my medical decision making (see chart for details). ? ?  ?Rash, Shingles.  Patient states her current rash is similar to a previous episode of shingles.  Treating with Valtrex.  Education provided on shingles.  Discussed watching for signs of cellulitis.  Instructed her to follow-up with her PCP on Monday.  Instructed her to follow-up right away if she notes signs of cellulitis.  She agrees to plan of care. ? ?Final Clinical Impressions(s) / UC Diagnoses  ? ?Final diagnoses:  ?Rash  ?Herpes zoster without complication  ? ? ? ?Discharge Instructions   ? ?  ?Take the valacyclovir as directed.  Follow up with your primary care provider on Monday.   ? ? ? ? ? ?ED Prescriptions   ? ? Medication Sig Dispense Auth. Provider  ? valACYclovir (VALTREX) 1000 MG tablet Take 1 tablet (1,000 mg total) by mouth every 8 (eight) hours. 21 tablet Sharion Balloon, NP  ? ?  ? ?PDMP not reviewed this encounter. ?  ?Sharion Balloon, NP ?12/16/21 1016 ? ?

## 2021-12-16 NOTE — Discharge Instructions (Addendum)
Take the valacyclovir as directed.  Follow up with your primary care provider on Monday.   ? ?

## 2021-12-16 NOTE — ED Triage Notes (Signed)
Pt here with blistering, painful, and itching rash to left leg x 4 days. ?

## 2022-05-27 ENCOUNTER — Encounter: Payer: Self-pay | Admitting: Emergency Medicine

## 2022-05-27 ENCOUNTER — Other Ambulatory Visit: Payer: Self-pay

## 2022-05-27 ENCOUNTER — Emergency Department
Admission: EM | Admit: 2022-05-27 | Discharge: 2022-05-27 | Disposition: A | Discharge status: Home / Self Care | Payer: BC Managed Care – PPO | Attending: Emergency Medicine | Admitting: Emergency Medicine

## 2022-05-27 ENCOUNTER — Emergency Department: Payer: BC Managed Care – PPO

## 2022-05-27 DIAGNOSIS — R0601 Orthopnea: Secondary | ICD-10-CM

## 2022-05-27 DIAGNOSIS — R0789 Other chest pain: Secondary | ICD-10-CM | POA: Diagnosis present

## 2022-05-27 DIAGNOSIS — R600 Localized edema: Secondary | ICD-10-CM | POA: Diagnosis not present

## 2022-05-27 DIAGNOSIS — I509 Heart failure, unspecified: Secondary | ICD-10-CM | POA: Insufficient documentation

## 2022-05-27 DIAGNOSIS — I110 Hypertensive heart disease with heart failure: Secondary | ICD-10-CM | POA: Diagnosis not present

## 2022-05-27 LAB — BASIC METABOLIC PANEL
Anion gap: 10 (ref 5–15)
BUN: 19 mg/dL (ref 6–20)
CO2: 21 mmol/L — ABNORMAL LOW (ref 22–32)
Calcium: 9.3 mg/dL (ref 8.9–10.3)
Chloride: 106 mmol/L (ref 98–111)
Creatinine, Ser: 0.67 mg/dL (ref 0.44–1.00)
GFR, Estimated: >60 mL/min (ref >60)
Glucose, Bld: 111 mg/dL — ABNORMAL HIGH (ref 70–99)
Potassium: 4.2 mmol/L (ref 3.5–5.1)
Sodium: 137 mmol/L (ref 135–145)

## 2022-05-27 LAB — CBC
HCT: 42.2 % (ref 36.0–46.0)
Hemoglobin: 12 g/dL (ref 12.0–15.0)
MCH: 27.8 pg (ref 26.0–34.0)
MCHC: 28.4 g/dL — ABNORMAL LOW (ref 30.0–36.0)
MCV: 97.7 fL (ref 80.0–100.0)
Platelets: 199 10*3/uL (ref 150–400)
RBC: 4.32 MIL/uL (ref 3.87–5.11)
RDW: 14.7 % (ref 11.5–15.5)
WBC: 4.8 10*3/uL (ref 4.0–10.5)
nRBC: 0 % (ref 0.0–0.2)

## 2022-05-27 LAB — TROPONIN I (HIGH SENSITIVITY)
Troponin I (High Sensitivity): 3 ng/L (ref <18)
Troponin I (High Sensitivity): 3 ng/L (ref <18)

## 2022-05-27 MED ORDER — FUROSEMIDE 40 MG PO TABS
40.0000 mg | ORAL_TABLET | Freq: Once | ORAL | Status: AC
  Administered 2022-05-27: 40 mg via ORAL
  Filled 2022-05-27: qty 1

## 2022-05-27 MED ORDER — FUROSEMIDE 20 MG PO TABS: 20.0000 mg | ORAL_TABLET | Freq: Every day | ORAL | 1 refills | Status: DC

## 2022-05-27 NOTE — ED Provider Triage Note (Signed)
  Emergency Medicine Provider Triage Evaluation Note  Shelby Clarke , a 56 y.o.female,  was evaluated in triage.  Pt complains of chest tightness and chest congestion x1 month.  Additionally reports some radiation to the back and neck as well.  Reports some dizziness and nausea intermittently.   Review of Systems  Positive: Chest pain, congestion, dizziness, nausea Negative: Denies fever, abdominal pain, vomiting  Physical Exam   Vitals:   05/27/22 1504  BP: (!) 158/71  Pulse: 72  Resp: 18  Temp: 97.7 F (36.5 C)  SpO2: 98%   Gen:   Awake, no distress   Resp:  Normal effort MSK:   Moves extremities without difficulty  Other:    Medical Decision Making  Given the patient's initial medical screening exam, the following diagnostic evaluation has been ordered. The patient will be placed in the appropriate treatment space, once one is available, to complete the evaluation and treatment. I have discussed the plan of care with the patient and I have advised the patient that an ED physician or mid-level practitioner will reevaluate their condition after the test results have been received, as the results may give them additional insight into the type of treatment they may need.    Diagnostics: Labs, EKG, CXR  Treatments: none immediately   Teodoro Spray, [place] 05/27/22 1702

## 2022-05-27 NOTE — ED Triage Notes (Signed)
Pt reports chest tightness and "phlegm in my chest." Pt reports that the pain radiates to her back and neck. Pt also stating she is getting dizzy and nausea.

## 2022-05-27 NOTE — ED Provider Notes (Signed)
[facility] Provider Note    Event Date/Time   First MD Initiated Contact with Patient 05/27/22 1949     (approximate)   History   Chest Pain   HPI  Shelby Clarke is a 56 y.o. female who presents to the ED for evaluation of Chest Pain   I reviewed GI clinic visit from last year.  History of chronic diarrhea and IBS.  HTN, morbid obesity  Patient presents to the ED with 1 month of progressively worsening orthopnea, dyspnea on exertion, increased lower extremity swelling and episodes of chest pain and pressure.  She reports positional chest pain, it is worse when she is lying flat as well as occasional discomfort with exertion.  Reports inability to get into see her PCP for greater than the past month and so she presents to the ED today due to progressive worsening of the symptoms.  No chest pain at rest.  No fever or syncope.    Physical Exam   Triage Vital Signs: ED Triage Vitals  Enc Vitals Group     BP 05/27/22 1504 (!) 158/71     Pulse Rate 05/27/22 1504 72     Resp 05/27/22 1504 18     Temp 05/27/22 1504 97.7 F (36.5 C)     Temp Source 05/27/22 1504 Oral     SpO2 05/27/22 1504 98 %     Weight 05/27/22 1504 (!) 324 lb (147 kg)     Height 05/27/22 1504 '5\' 5"'$  (1.651 m)     Head Circumference --      Peak Flow --      Pain Score 05/27/22 1511 4     Pain Loc --      Pain Edu? --      Excl. in [place]? --     Most recent vital signs: Vitals:   05/27/22 1929 05/27/22 2045  BP: (!) 120/58 136/60  Pulse: 70 79  Resp: 16 18  Temp: 98.1 F (36.7 C)   SpO2: 95% 100%    General: Awake, no distress.  Morbidly obese.  Looks well.  Pleasant and conversational. CV:  Good peripheral perfusion.  Resp:  Normal effort.  Abd:  No distention.  MSK:  No deformity noted.  Trace pitting edema to bilateral lower extremities Neuro:  No focal deficits appreciated. Other:     ED Results / Procedures / Treatments   Labs (all labs ordered are listed,  but only abnormal results are displayed) Labs Reviewed  BASIC METABOLIC PANEL - Abnormal; Notable for the following components:      Result Value   CO2 21 (*)    Glucose, Bld 111 (*)    All other components within normal limits  CBC - Abnormal; Notable for the following components:   MCHC 28.4 (*)    All other components within normal limits  BRAIN NATRIURETIC PEPTIDE  TROPONIN I (HIGH SENSITIVITY)  TROPONIN I (HIGH SENSITIVITY)    EKG Sinus rhythm with a rate of 70 bpm.  Normal axis and intervals.  No clear signs of acute ischemia.  RADIOLOGY 2 view CXR interpreted by me with increased pulmonary vascular congestion without pleural effusion  Official radiology report(s): DG Chest 2 View  Result Date: 05/27/2022 CLINICAL DATA:  Chest pain.  Chest tightness, "Phlegm in my chest". EXAM: CHEST - 2 VIEW COMPARISON:  None Available. FINDINGS: The heart size and mediastinal contours are within normal limits. No focal consolidation or effusion. Diffuse interstitial markings, which may be  sequela of prior infectious/inflammatory process. The visualized skeletal structures are unremarkable. IMPRESSION: No focal consolidation or pleural effusion. Electronically Signed   By: Keane Police D.O.   On: 05/27/2022 16:22    PROCEDURES and INTERVENTIONS:  .1-3 Lead EKG Interpretation  Performed by: Vladimir Crofts, MD Authorized by: Vladimir Crofts, MD     Interpretation: normal     ECG rate:  72   ECG rate assessment: normal     Rhythm: sinus rhythm     Ectopy: none     Conduction: normal     Medications  furosemide (LASIX) tablet 40 mg (40 mg Oral Given 05/27/22 2059)     IMPRESSION / MDM / ASSESSMENT AND PLAN / ED COURSE  I reviewed the triage vital signs and the nursing notes.  Differential diagnosis includes, but is not limited to, ACS, CHF, pneumonia, stroke, COPD, PTX, aortic emergency  {Patient presents with symptoms of an acute illness or injury that is potentially  life-threatening.  56 year old female presents to the ED with a month of progressively worsening symptoms that likely represent new onset CHF, suitable for outpatient management cardiology referral.  No hypoxia or instability.  Has some signs of volume overload with pitting edema, but no distress.  CXR with mild congestion without effusion.  2 negative troponins, nonischemic EKG, reassuring and essentially normal metabolic panel and CBC.  I considered observation admission for this patient, but I believe she would be suitable for trial of outpatient management with empiric initiation of diuretics and close follow-up/referral with cardiology.  We discussed return precautions.      FINAL CLINICAL IMPRESSION(S) / ED DIAGNOSES   Final diagnoses:  Other chest pain  New onset of congestive heart failure ([place])  Orthopnea     Rx / DC Orders   ED Discharge Orders          Ordered    Ambulatory referral to Cardiology       Comments: If you have not heard from the Cardiology office within the next 72 hours please call [phone].   05/27/22 2047    furosemide (LASIX) 20 MG tablet  Daily        05/27/22 2047             Note:  This document was prepared using Dragon voice recognition software and may include unintentional dictation errors.   Vladimir Crofts, MD 05/27/22 2118

## 2022-06-07 ENCOUNTER — Ambulatory Visit: Payer: BC Managed Care – PPO | Attending: Internal Medicine | Admitting: Internal Medicine

## 2022-06-07 ENCOUNTER — Encounter: Payer: Self-pay | Admitting: Internal Medicine

## 2022-06-07 VITALS — BP 126/76 | HR 69 | Ht 65.0 in | Wt 317.6 lb

## 2022-06-07 DIAGNOSIS — R0600 Dyspnea, unspecified: Secondary | ICD-10-CM | POA: Diagnosis not present

## 2022-06-07 NOTE — Progress Notes (Signed)
Cardiology Office Note:    Date:  06/07/2022   ID:  Shelby Clarke, DOB [birth year], MRN [id number]  PCP:  Kathyrn Lass   [facility] Providers Cardiologist:  None     Referring MD: Vladimir Crofts, MD   No chief complaint on file. SOB  History of Present Illness:    Shelby Clarke is a 56 y.o. female with a hx of HTN, presented to the ED with SOB, orthopnea and LE edema. She is morbidly obese. Cxray showed interstitial markings c/w prior infectious/inflammatory process. No BNP or TTE was obtained. Referral was sent with c/f HFpEF. Today she reports URI symptoms. She was taking a decongestant. She coughs up. She notes bending over and felt chest pain.  Notes difficulty with laying down for a month and feels like she's choking. No snoring or apnea. She was started on lasix 20 mg. She notes improvement with leg swelling.   Social Hx:  Non smoker  Family Hx: uncle had heart dx. Mother with hx of congestive HF  Past Medical History:  Diagnosis Date   Hypertension    Pre-diabetes    Thyroid disease     Past Surgical History:  Procedure Laterality Date   COLONOSCOPY WITH PROPOFOL N/A 03/27/2021   Procedure: COLONOSCOPY WITH PROPOFOL;  Surgeon: Lin Landsman, MD;  Location: [facility] ENDOSCOPY;  Service: Gastroenterology;  Laterality: N/A;   ESOPHAGOGASTRODUODENOSCOPY (EGD) WITH PROPOFOL N/A 03/27/2021   Procedure: ESOPHAGOGASTRODUODENOSCOPY (EGD) WITH PROPOFOL;  Surgeon: Lin Landsman, MD;  Location: [facility] ENDOSCOPY;  Service: Gastroenterology;  Laterality: N/A;   FOOT SURGERY      Current Medications: Current Meds  Medication Sig   ACETAMINOPHEN PO Take 200 mg by mouth as needed. Tylenol   ASPIRIN 81 PO Take 81 mg by mouth daily.   diclofenac Sodium (VOLTAREN) 1 % GEL SMARTSIG:2 Gram(s) Topical 3 Times Daily PRN   furosemide (LASIX) 20 MG tablet Take 1 tablet (20 mg total) by mouth daily.   levothyroxine (SYNTHROID) 50 MCG tablet TAKE 1 TABLET BY MOUTH ONCE DAILY FOR  HYPOTHYROIDISM   lisinopril (ZESTRIL) 5 MG tablet Take 1 tablet by mouth daily.   loratadine (CLARITIN) 10 MG tablet    meloxicam (MOBIC) 15 MG tablet TAKE 1 TABLET BY MOUTH ONCE DAILY FOR PAIN   metFORMIN (GLUCOPHAGE-XR) 500 MG 24 hr tablet TAKE 1 TABLET BY MOUTH ONCE DAILY FOR PREDIABETES   omeprazole (PRILOSEC) 20 MG capsule Take 1 capsule by mouth daily.   traZODone (DESYREL) 50 MG tablet TAKE 1 TABLET BY MOUTH NIGHTLY AT BEDTIME     Allergies:   Patient has no known allergies.   Social History   Socioeconomic History   Marital status: Married    Spouse name: Not on file   Number of children: Not on file   Years of education: Not on file   Highest education level: Not on file  Occupational History   Not on file  Tobacco Use   Smoking status: Never   Smokeless tobacco: Never  Substance and Sexual Activity   Alcohol use: Not Currently   Drug use: Not Currently   Sexual activity: Not on file  Other Topics Concern   Not on file  Social History Narrative   Not on file   Social Determinants of Health   Financial Resource Strain: Not on file  Food Insecurity: Not on file  Transportation Needs: Not on file  Physical Activity: Not on file  Stress: Not on file  Social Connections: Not on  file     Family History: Per above  ROS:   Please see the history of present illness.     All other systems reviewed and are negative.  EKGs/Labs/Other Studies Reviewed:    The following studies were reviewed today:   EKG:  EKG is  ordered today.  The ekg ordered today demonstrates   06/07/2022- NSR   Recent Labs: 05/27/2022: BUN 19; Creatinine, Ser 0.67; Hemoglobin 12.0; Platelets 199; Potassium 4.2; Sodium 137   Recent Lipid Panel No results found for: "CHOL", "TRIG", "HDL", "CHOLHDL", "VLDL", "LDLCALC", "LDLDIRECT"   Risk Assessment/Calculations:     Physical Exam:    VS:    Vitals:   06/07/22 0934 06/07/22 0940  BP: 116/74 126/76  Pulse: 69   SpO2: 97%       Wt Readings from Last 3 Encounters:  06/07/22 (!) 317 lb 9.6 oz (144.1 kg)  05/27/22 (!) 324 lb (147 kg)  06/13/21 (!) 302 lb (137 kg)     GEN: Obese, Well nourished, well developed in no acute distress HEENT: Normal NECK: No JVD; No carotid bruits LYMPHATICS: No lymphadenopathy CARDIAC: RRR, no murmurs, rubs, gallops RESPIRATORY:  Clear to auscultation without rales, wheezing or rhonchi  ABDOMEN: Soft, non-tender, non-distended MUSCULOSKELETAL:  legs large, No pitting edema, No deformity  SKIN: Warm and dry NEUROLOGIC:  Alert and oriented x 3 PSYCHIATRIC:  Normal affect   ASSESSMENT:    HFpEF: she has morbid obesity and signs of CHF. This is also in the setting of a URI. Will plan to continue her lasix with improvement. Will obtain TTE and BNP. We discussed that her weight is the primary issue and will send a referral to the [facility].  PLAN:    In order of problems listed above:  TTE BNP Lipid profile, A1c [facility] Follow up 3 months           Medication Adjustments/Labs and Tests Ordered: Current medicines are reviewed at length with the patient today.  Concerns regarding medicines are outlined above.  Orders Placed This Encounter  Procedures   Lipid panel   Brain natriuretic peptide   Hemoglobin A1c   Amb Ref to Medical Weight Management   EKG 12-Lead   ECHOCARDIOGRAM COMPLETE   No orders of the defined types were placed in this encounter.   Patient Instructions  Medication Instructions:  The current medical regimen is effective;  continue present plan and medications.  *If you need a refill on your cardiac medications before your next appointment, please call your pharmacy*   Lab Work: BNP, A1C, LIPID  If you have labs (blood work) drawn today and your tests are completely normal, you will receive your results only by: [place] (if you have MyChart) OR A paper copy in the mail If you  have any lab test that is abnormal or we need to change your treatment, we will call you to review the results.   Testing/Procedures: Echocardiogram - Your physician has requested that you have an echocardiogram. Echocardiography is a painless test that uses sound waves to create images of your heart. It provides your doctor with information about the size and shape of your heart and how well your heart's chambers and valves are working. This procedure takes approximately one hour. There are no restrictions for this procedure.     Follow-Up: At [facility], you and your health needs are our priority.  As part of our continuing mission to provide you  with exceptional heart care, we have created designated Provider Care Teams.  These Care Teams include your primary Cardiologist (physician) and Advanced Practice Providers (APPs -  Physician Assistants and Nurse Practitioners) who all work together to provide you with the care you need, when you need it.  We recommend signing up for the patient portal called "MyChart".  Sign up information is provided on this After Visit Summary.  MyChart is used to connect with patients for Virtual Visits (Telemedicine).  Patients are able to view lab/test results, encounter notes, upcoming appointments, etc.  Non-urgent messages can be sent to your provider as well.   To learn more about what you can do with MyChart, go to [email or web address].    Your next appointment:   3 month(s)  The format for your next appointment:   In Person  Provider:   Phineas Inches, MD   Referral to healthy weight and wellness, they will contact you for an appointment.       Signed, Janina Mayo, MD  06/07/2022 10:10 AM    [place]

## 2022-06-07 NOTE — Patient Instructions (Signed)
Medication Instructions:  The current medical regimen is effective;  continue present plan and medications.  *If you need a refill on your cardiac medications before your next appointment, please call your pharmacy*   Lab Work: BNP, A1C, LIPID  If you have labs (blood work) drawn today and your tests are completely normal, you will receive your results only by: [place] (if you have MyChart) OR A paper copy in the mail If you have any lab test that is abnormal or we need to change your treatment, we will call you to review the results.   Testing/Procedures: Echocardiogram - Your physician has requested that you have an echocardiogram. Echocardiography is a painless test that uses sound waves to create images of your heart. It provides your doctor with information about the size and shape of your heart and how well your heart's chambers and valves are working. This procedure takes approximately one hour. There are no restrictions for this procedure.     Follow-Up: At [facility], you and your health needs are our priority.  As part of our continuing mission to provide you with exceptional heart care, we have created designated Provider Care Teams.  These Care Teams include your primary Cardiologist (physician) and Advanced Practice Providers (APPs -  Physician Assistants and Nurse Practitioners) who all work together to provide you with the care you need, when you need it.  We recommend signing up for the patient portal called "MyChart".  Sign up information is provided on this After Visit Summary.  MyChart is used to connect with patients for Virtual Visits (Telemedicine).  Patients are able to view lab/test results, encounter notes, upcoming appointments, etc.  Non-urgent messages can be sent to your provider as well.   To learn more about what you can do with MyChart, go to [email or web address].    Your next appointment:   3 month(s)  The format for your next  appointment:   In Person  Provider:   Phineas Inches, MD   Referral to healthy weight and wellness, they will contact you for an appointment.

## 2022-06-29 ENCOUNTER — Ambulatory Visit (HOSPITAL_COMMUNITY): Payer: BC Managed Care – PPO | Attending: Internal Medicine

## 2022-06-29 DIAGNOSIS — R0600 Dyspnea, unspecified: Secondary | ICD-10-CM | POA: Insufficient documentation

## 2022-06-29 LAB — LIPID PANEL
Chol/HDL Ratio: 4.2 ratio (ref 0.0–4.4)
Cholesterol, Total: 209 mg/dL — ABNORMAL HIGH (ref 100–199)
HDL: 50 mg/dL (ref >39)
LDL Chol Calc (NIH): 137 mg/dL — ABNORMAL HIGH (ref 0–99)
Triglycerides: 122 mg/dL (ref 0–149)
VLDL Cholesterol Cal: 22 mg/dL (ref 5–40)

## 2022-06-29 LAB — HEMOGLOBIN A1C
Est. average glucose Bld gHb Est-mCnc: 126 mg/dL
Hgb A1c MFr Bld: 6 % — ABNORMAL HIGH (ref 4.8–5.6)

## 2022-06-29 LAB — BRAIN NATRIURETIC PEPTIDE: BNP: 28.8 pg/mL (ref 0.0–100.0)

## 2022-07-03 LAB — ECHOCARDIOGRAM COMPLETE
Area-P 1/2: 4.2 cm2
S' Lateral: 2.1 cm

## 2022-08-10 ENCOUNTER — Ambulatory Visit: Payer: BC Managed Care – PPO | Admitting: Internal Medicine

## 2022-10-24 ENCOUNTER — Ambulatory Visit (INDEPENDENT_AMBULATORY_CARE_PROVIDER_SITE_OTHER): Payer: BC Managed Care – PPO | Admitting: Family Medicine

## 2022-10-24 ENCOUNTER — Encounter (INDEPENDENT_AMBULATORY_CARE_PROVIDER_SITE_OTHER): Payer: Self-pay | Admitting: Family Medicine

## 2022-10-24 VITALS — BP 136/83 | HR 75 | Temp 97.6°F | Ht 64.0 in | Wt 319.0 lb

## 2022-10-24 DIAGNOSIS — Z6841 Body Mass Index (BMI) 40.0 and over, adult: Secondary | ICD-10-CM

## 2022-10-24 DIAGNOSIS — E038 Other specified hypothyroidism: Secondary | ICD-10-CM | POA: Diagnosis not present

## 2022-10-24 DIAGNOSIS — Z0289 Encounter for other administrative examinations: Secondary | ICD-10-CM

## 2022-10-24 DIAGNOSIS — E669 Obesity, unspecified: Secondary | ICD-10-CM

## 2022-10-24 DIAGNOSIS — G4730 Sleep apnea, unspecified: Secondary | ICD-10-CM

## 2022-10-24 DIAGNOSIS — R7303 Prediabetes: Secondary | ICD-10-CM | POA: Diagnosis not present

## 2022-11-15 NOTE — Progress Notes (Signed)
Office: [phone]  /  Fax: [phone]   Initial Visit  Shelby Clarke was seen in clinic today to evaluate for obesity. She is interested in losing weight to improve overall health and reduce the risk of weight related complications. She presents today to review program treatment options, initial physical assessment, and evaluation.     She was referred by: Specialist, Dr Harl Bowie  When asked what else they would like to accomplish? She states: Reduce number of medications, Improve quality of life, and Lose a target amount of weight : 225 lbs  When asked how has your weight affected you? She states: Contributed to orthopedic problems or mobility issues, Having fatigue, Having poor endurance, and Other: She has gained 40 LBS in the past year.  Some associated conditions: Hypertension, Prediabetes, and GERD  Contributing factors: Family history, mother and daughter. Disruption of circadian rhythm, Reduced physical activity, Menopause, and Other: Knee pain, left knee DJD.  Poor sleep patterns, skips breakfast.  Last meal at 10 PM positive for mindless snacking on chips.  Weight promoting medications identified: Other: n/a  Current nutrition plan: None  Current level of physical activity: None  Current or previous pharmacotherapy: Other: Took " a pill" it works short-term Radiation protection practitioner).  Response to medication: Ineffective so it was discontinued  Past medical history includes:   Past Medical History:  Diagnosis Date   Hypertension    Pre-diabetes    Thyroid disease    Objective:   BP 136/83   Pulse 75   Temp 97.6 F (36.4 C)   Ht 5' 4"$  (1.626 m)   Wt (!) 319 lb (144.7 kg)   SpO2 99%   BMI 54.76 kg/m  She was weighed on the bioimpedance scale: Body mass index is 54.76 kg/m.  Peak Weight:319 lbs ,Visceral Fat Rating:25, Body Fat%:61.9,   General:  Alert, oriented and cooperative. Patient is in no acute distress.  Respiratory: Normal respiratory effort, no problems with  respiration noted  Extremities: Normal range of motion.    Mental Status: Normal mood and affect. Normal behavior. Normal judgment and thought content.   Assessment and Plan:  1. Pre-diabetes Patient was taking metformin 500 mg XR daily.  Patient has been IBS, diarrhea reviewed working on metformin.  Keep upcoming PCP visit and bring in copy of labs.  2. Other specified hypothyroidism Patient is taking levothyroxine 50 mcg daily.  Patient complains of lack of energy.  She has a follow-up with her PCP scheduled to recheck levels.  Continue levothyroxine 50 mcg daily and bring in copy of labs.  3. Sleep-disordered breathing Patient has never had a polysomnogram but reports poor sleep quality, daytime fatigue and weight gain.  Will likely need sleep medicine consult.  4. Obesity,current BMI 54.9 1.  Reviewed bioimpedance results. 2.  Think about ways to incorporate more physical activity given limitations.  We reviewed weight, biometrics, associated medical conditions and contributing factors with patient. She would benefit from weight loss therapy via a modified calorie, low-carb, high-protein nutritional plan tailored to their REE (resting energy expenditure) which will be determined by indirect calorimetry.  We will also assess for cardiometabolic risk and nutritional derangements via fasting serologies at her next appointment.     Obesity Treatment / Action Plan:  Patient will work on garnering support from family and friends to begin weight loss journey. Will work on eliminating or reducing the presence of highly palatable, calorie dense foods in the home. Will complete provided nutritional and psychosocial assessment questionnaire before the  next appointment. Will be scheduled for indirect calorimetry to determine resting energy expenditure in a fasting state.  This will allow [place] to create a reduced calorie, high-protein meal plan to promote loss of fat mass while preserving muscle  mass. Will think about ideas on how to incorporate physical activity into their daily routine. Was counseled on nutritional approaches to weight loss and benefits of complex carbs and high quality protein as part of nutritional weight management. Was counseled on pharmacotherapy and role as an adjunct in weight management.   Obesity Education Performed Today:  She was weighed on the bioimpedance scale and results were discussed and documented in the synopsis.  We discussed obesity as a disease and the importance of a more detailed evaluation of all the factors contributing to the disease.  We discussed the importance of long term lifestyle changes which include nutrition, exercise and behavioral modifications as well as the importance of customizing this to her specific health and social needs.  We discussed the benefits of reaching a healthier weight to alleviate the symptoms of existing conditions and reduce the risks of the biomechanical, metabolic and psychological effects of obesity.  Shelby Clarke appears to be in the action stage of change and states they are ready to start intensive lifestyle modifications and behavioral modifications.  30 minutes was spent today on this visit including the above counseling, pre-visit chart review, and post-visit documentation.  Reviewed by clinician on day of visit: allergies, medications, problem list, medical history, surgical history, family history, social history, and previous encounter notes.  I, Davy Pique, am acting as Location manager for Loyal Gambler, DO.  I have reviewed the above documentation for accuracy and completeness, and I agree with the above. Dell Ponto, DO

## 2022-11-22 ENCOUNTER — Ambulatory Visit (INDEPENDENT_AMBULATORY_CARE_PROVIDER_SITE_OTHER): Payer: BC Managed Care – PPO | Admitting: Family Medicine

## 2022-12-06 ENCOUNTER — Ambulatory Visit (INDEPENDENT_AMBULATORY_CARE_PROVIDER_SITE_OTHER): Payer: BC Managed Care – PPO | Admitting: Family Medicine

## 2022-12-19 ENCOUNTER — Encounter (INDEPENDENT_AMBULATORY_CARE_PROVIDER_SITE_OTHER): Payer: Self-pay | Admitting: Family Medicine

## 2022-12-19 ENCOUNTER — Ambulatory Visit (INDEPENDENT_AMBULATORY_CARE_PROVIDER_SITE_OTHER): Payer: BC Managed Care – PPO | Admitting: Family Medicine

## 2022-12-19 VITALS — BP 139/83 | HR 69 | Temp 98.0°F | Ht 64.0 in | Wt 316.0 lb

## 2022-12-19 DIAGNOSIS — R5383 Other fatigue: Secondary | ICD-10-CM

## 2022-12-19 DIAGNOSIS — F32A Depression, unspecified: Secondary | ICD-10-CM | POA: Insufficient documentation

## 2022-12-19 DIAGNOSIS — G4730 Sleep apnea, unspecified: Secondary | ICD-10-CM

## 2022-12-19 DIAGNOSIS — E038 Other specified hypothyroidism: Secondary | ICD-10-CM | POA: Diagnosis not present

## 2022-12-19 DIAGNOSIS — K219 Gastro-esophageal reflux disease without esophagitis: Secondary | ICD-10-CM

## 2022-12-19 DIAGNOSIS — I10 Essential (primary) hypertension: Secondary | ICD-10-CM | POA: Insufficient documentation

## 2022-12-19 DIAGNOSIS — R0602 Shortness of breath: Secondary | ICD-10-CM | POA: Diagnosis not present

## 2022-12-19 DIAGNOSIS — F3289 Other specified depressive episodes: Secondary | ICD-10-CM

## 2022-12-19 DIAGNOSIS — M170 Bilateral primary osteoarthritis of knee: Secondary | ICD-10-CM

## 2022-12-19 DIAGNOSIS — Z1331 Encounter for screening for depression: Secondary | ICD-10-CM | POA: Insufficient documentation

## 2022-12-19 DIAGNOSIS — Z6841 Body Mass Index (BMI) 40.0 and over, adult: Secondary | ICD-10-CM

## 2022-12-19 DIAGNOSIS — R7303 Prediabetes: Secondary | ICD-10-CM | POA: Diagnosis not present

## 2022-12-24 ENCOUNTER — Encounter (INDEPENDENT_AMBULATORY_CARE_PROVIDER_SITE_OTHER): Payer: Self-pay | Admitting: *Deleted

## 2022-12-25 NOTE — Progress Notes (Signed)
Chief Complaint:   [place] (MR# [id number]) is a 57 y.o. female who presents for evaluation and treatment of obesity and related comorbidities. Current BMI is Body mass index is 54.24 kg/m. Shelby Clarke has been struggling with her weight for many years and has been unsuccessful in either losing weight, maintaining weight loss, or reaching her healthy weight goal.  Shelby Clarke lives at home with her husband and 23 year old granddaughter.  Patient does not work.  Patient would like to lose 90 LBS.  She snacks on chips, likes soda, chai latte, sweet tea.  She has considered weight loss surgery.  Labs were reviewed from  10/30/2022: CBC, CMP, LDL 119, A1c 6.1, TSH 4.1.  [name] is currently in the action stage of change and ready to dedicate time achieving and maintaining a healthier weight. Shelby Clarke is interested in becoming our patient and working on intensive lifestyle modifications including (but not limited to) diet and exercise for weight loss.  Shelby Clarke's habits were reviewed today and are as follows: Her family eats meals together, she thinks her family will eat healthier with her, her desired weight loss is 91 lbs, she has been heavy most of her life, she started gaining weight in her 20's, after marriage and having children, her heaviest weight ever was 319 pounds, she has significant food cravings issues, she snacks frequently in the evenings, she wakes up frequently in the middle of the night to eat, she skips meals frequently, she is frequently drinking liquids with calories, she frequently makes poor food choices, she frequently eats larger portions than normal, and she struggles with emotional eating.  Depression Screen Sugar's Food and Mood (modified PHQ-9) score was 15.  Subjective:   1. Other fatigue Shelby Clarke admits to daytime somnolence and admits to waking up still tired. Patient has a history of symptoms of daytime fatigue and morning fatigue. Shelby Clarke generally gets 4  or 5 hours of sleep per night, and states that she has nightime awakenings. Snoring is present. Apneic episodes are present. Epworth Sleepiness Score is 14.  Epworth score 14.  EKG NSR at 74 bpm, low voltage.  2. SOBOE (shortness of breath on exertion) Shelby Clarke notes increasing shortness of breath with exercising and seems to be worsening over time with weight gain. She notes getting out of breath sooner with activity than she used to. This has not gotten worse recently. Shelby Clarke denies shortness of breath at rest or orthopnea.  3. Essential hypertension Blood pressure well-controlled on lisinopril 5 mg daily.  Patient denies DOE, takes Lasix as needed with history of CHF, August 2023.  4. Pre-diabetes On 06/28/2022, A1c was 6.0.  Patient is on metformin 500 mg daily, has occasional diarrhea.  5. Other specified hypothyroidism Patient is taking levothyroxine 50 mcg daily.  Patient complains of fatigue and constipation.  6. Gastroesophageal reflux disease, unspecified whether esophagitis present Patient is taking omeprazole 20 mg daily.  7. Osteoarthritis of both knees, unspecified osteoarthritis type Patient complains of left and right knee pain.  Pain limits ability to stand, cook and has limited exercise.  Patient is taking meloxicam 15 mg daily.  8. Sleep-disordered breathing Patient has sleep disordered breathing.  9. Other depression with emotional eating Bariatric PHQ-9:15.  Patient is not on any mood medications.  Patient has a good support system at home.  Assessment/Plan:   1. Other fatigue Shelby Clarke does feel that her weight is causing her energy to be lower than it should be. Fatigue may be related to obesity,  depression or many other causes. Labs will be ordered, and in the meanwhile, Konni will focus on self care including making healthy food choices, increasing physical activity and focusing on stress reduction.  Check IC and fasting labs at next visit.  - EKG 12-Lead -  VITAMIN D 25 Hydroxy (Vit-D Deficiency, Fractures) - Vitamin B12 - TSH Rfx on Abnormal to Free T4 - Insulin, random - Folate  2. SOBOE (shortness of breath on exertion) Shelby Clarke does feel that she gets out of breath more easily that she used to when she exercises. Shelby Clarke's shortness of breath appears to be obesity related and exercise induced. She has agreed to work on weight loss and gradually increase exercise to treat her exercise induced shortness of breath. Will continue to monitor closely.  3. Essential hypertension Look for blood pressure improvements with weight loss.  4. Pre-diabetes Check fasting insulin next visit.  Begin sugar reduction.  - Insulin, random  5. Other specified hypothyroidism Check labs at next office visit.  - TSH Rfx on Abnormal to Free T4  6. Gastroesophageal reflux disease, unspecified whether esophagitis present Avoid late night eating.  Look for improvements with weight loss.  7. Osteoarthritis of both knees, unspecified osteoarthritis type Look for improvements with weight loss.  8. Sleep-disordered breathing Set up referral to neurology.  Referral- Ambulatory referral to Neurology  9. Other depression with emotional eating Keep junk food triggers out of the house.  Patient has failed Contrave in the past.  10. Depression screen Shelby Clarke had a positive depression screening. Depression is commonly associated with obesity and often results in emotional eating behaviors. We will monitor this closely and work on CBT to help improve the non-hunger eating patterns. Referral to Psychology may be required if no improvement is seen as she continues in our clinic.  11. Morbid obesity ([place])  12. Obesity,current BMI 54.3 1.Try plant based protein shakes. 2. Consider Saxenda 3. Check labs at next visit.  - VITAMIN D 25 Hydroxy (Vit-D Deficiency, Fractures) - Vitamin B12 - Insulin, random  Sabel is currently in the action stage of change and her  goal is to continue with weight loss efforts. I recommend Juanesha begin the structured treatment plan as follows:  She has agreed to keeping a food journal and adhering to recommended goals of 1600 calories and 120 protein. Start logging in the lose it app.   Exercise goals: All adults should avoid inactivity. Some physical activity is better than none, and adults who participate in any amount of physical activity gain some health benefits.   Behavioral modification strategies: increasing lean protein intake, decreasing liquid calories, planning for success, keeping a strict food journal, and decreasing junk food.  She was informed of the importance of frequent follow-up visits to maximize her success with intensive lifestyle modifications for her multiple health conditions. She was informed we would discuss her lab results at her next visit unless there is a critical issue that needs to be addressed sooner. Shelby Clarke agreed to keep her next visit at the agreed upon time to discuss these results.  Objective:   Blood pressure 139/83, pulse 69, temperature 98 F (36.7 C), height 5\' 4"  (1.626 m), weight (!) 316 lb (143.3 kg), SpO2 97 %. Body mass index is 54.24 kg/m.  EKG: Normal sinus rhythm, rate 74 bpm.  Indirect Calorimeter Unable to do IC today, patient was not fasting.  Her calculated basal metabolic rate is [id number].  General: Cooperative, alert, well developed, in no acute distress. HEENT: Conjunctivae  and lids unremarkable. Cardiovascular: Regular rhythm.  Lungs: Normal work of breathing. Neurologic: No focal deficits.   Lab Results  Component Value Date   CREATININE 0.67 05/27/2022   BUN 19 05/27/2022   NA 137 05/27/2022   K 4.2 05/27/2022   CL 106 05/27/2022   CO2 21 (L) 05/27/2022   Lab Results  Component Value Date   ALT 21 02/02/2019   AST 19 02/02/2019   ALKPHOS 69 02/02/2019   BILITOT 0.4 02/02/2019   Lab Results  Component Value Date   HGBA1C 6.0 (H) 06/28/2022    No results found for: "INSULIN" No results found for: "TSH" Lab Results  Component Value Date   CHOL 209 (H) 06/28/2022   HDL 50 06/28/2022   LDLCALC 137 (H) 06/28/2022   TRIG 122 06/28/2022   CHOLHDL 4.2 06/28/2022   Lab Results  Component Value Date   WBC 4.8 05/27/2022   HGB 12.0 05/27/2022   HCT 42.2 05/27/2022   MCV 97.7 05/27/2022   PLT 199 05/27/2022   No results found for: "IRON", "TIBC", "FERRITIN"  Attestation Statements:   Reviewed by clinician on day of visit: allergies, medications, problem list, medical history, surgical history, family history, social history, and previous encounter notes.  I have personally spent 40 minutes total time today in preparation, patient care, nutritional counseling and documentation for this visit, including the following: review of clinical lab tests; review of medical tests/procedures/services.     I, Davy Pique, am acting as Location manager for Loyal Gambler, DO.  I have reviewed the above documentation for accuracy and completeness, and I agree with the above. Dell Ponto, DO

## 2023-01-01 ENCOUNTER — Encounter (INDEPENDENT_AMBULATORY_CARE_PROVIDER_SITE_OTHER): Payer: Self-pay | Admitting: Family Medicine

## 2023-01-01 ENCOUNTER — Ambulatory Visit (INDEPENDENT_AMBULATORY_CARE_PROVIDER_SITE_OTHER): Payer: BC Managed Care – PPO | Admitting: Family Medicine

## 2023-01-01 VITALS — BP 125/74 | HR 80 | Temp 98.2°F | Ht 64.0 in | Wt 311.0 lb

## 2023-01-01 DIAGNOSIS — G4730 Sleep apnea, unspecified: Secondary | ICD-10-CM | POA: Diagnosis not present

## 2023-01-01 DIAGNOSIS — R7303 Prediabetes: Secondary | ICD-10-CM

## 2023-01-01 DIAGNOSIS — R0602 Shortness of breath: Secondary | ICD-10-CM | POA: Diagnosis not present

## 2023-01-01 DIAGNOSIS — M170 Bilateral primary osteoarthritis of knee: Secondary | ICD-10-CM

## 2023-01-01 DIAGNOSIS — Z6841 Body Mass Index (BMI) 40.0 and over, adult: Secondary | ICD-10-CM

## 2023-01-01 NOTE — Assessment & Plan Note (Signed)
Awaiting results of sleep study. Pt is high risk for OSA with sleep disordered breathing, BMI of 53 and poor sleep quality.

## 2023-01-01 NOTE — Assessment & Plan Note (Signed)
Reviewed IC test today. Awaiting sleep study results  Expected metabolic rate almost equals predicted metabolic rate at A999333 kcal/ day. She has been intaking 1600 kcal/ day and is losing weight.  Begin cat 3 meal plan Reviewed details of plan

## 2023-01-01 NOTE — Assessment & Plan Note (Signed)
L>R knee pain has been limiting her ability to walk much.  Sleep has also been disrupted due to pain.    Plan: begin chair exercises and short walks around the house.  Continue to work on weight reduction.

## 2023-01-01 NOTE — Assessment & Plan Note (Signed)
Lab Results  Component Value Date   HGBA1C 6.0 (H) 06/28/2022   Recheck A1c today.  She has never taken metformin.  Plan:  work on prescribed meal plan.  Read labels on food and drink for added sugar.  Limit refined carbs and added sugar.  Continue to work on weight reduction.

## 2023-01-01 NOTE — Progress Notes (Signed)
Office: [phone]  /  Fax: [name]  Starting Date: 12/19/22  Starting Weight: 316lb   Weight Lost Since Last Visit: 5lb   Vitals Temp: 98.2 F (36.8 C) BP: 125/74 Pulse Rate: 80 SpO2: 98 %   Body Composition  Body Fat %: 60.3 % Fat Mass (lbs): 187.6 lbs Muscle Mass (lbs): 117.4 lbs Visceral Fat Rating : 24    HPI  Chief Complaint: OBESITY  Shelby Clarke is here to discuss her progress with her obesity treatment plan. She is on the keeping a food journal and adhering to recommended goals of 1600 calories and 120 protein and states she is following her eating plan approximately 90 % of the time. She states she is exercising 0 minutes 0 times per week.  Interval History:  Since last office visit she is down 5 lb She has gained 2.8 lb of muscle, lost 7.8 lb of body fat in 2 weeks She is logging daily intake on the MyFitnessPal ap getting in 1600 kcal/ day.  She is getting in 90-100 g of protein daily.  She is reading labels for added sugar. Husband is supportive Getting more movement in the house Has bilateral DJD pain in knees that limits walking. Due for fasting labs today  Pharmacotherapy: none  PHYSICAL EXAM:  Blood pressure 125/74, pulse 80, temperature 98.2 F (36.8 C), height 5\' 4"  (1.626 m), weight (!) 311 lb (141.1 kg), SpO2 98 %. Body mass index is 53.38 kg/m.  General: She is overweight, cooperative, alert, well developed, and in no acute distress. PSYCH: Has normal mood, affect and thought process.   Lungs: Normal breathing effort, no conversational dyspnea.   ASSESSMENT AND PLAN  TREATMENT PLAN FOR OBESITY:  Recommended Dietary Goals  Shelby Clarke is currently in the action stage of change. As such, her goal is to continue weight management plan. She has agreed to the Category 3 Plan.  Behavioral Intervention  We discussed the following Behavioral Modification Strategies today: increasing lean protein intake,  increasing vegetables, increasing water intake, work on meal planning and easy cooking plans, planning for success, and keeping healthy foods at home.  Additional resources provided today: NA  Recommended Physical Activity Goals  Shelby Clarke has been advised to work up to 150 minutes of moderate intensity aerobic activity a week and strengthening exercises 2-3 times per week for cardiovascular health, weight loss maintenance and preservation of muscle mass.   She has agreed to Think about ways to increase physical activity  Pharmacotherapy changes for the treatment of obesity:  none  ASSOCIATED CONDITIONS ADDRESSED TODAY  Pre-diabetes Assessment & Plan: Lab Results  Component Value Date   HGBA1C 6.0 (H) 06/28/2022   Recheck A1c today.  She has never taken metformin.  Plan:  work on prescribed meal plan.  Read labels on food and drink for added sugar.  Limit refined carbs and added sugar.  Continue to work on weight reduction.  Orders: -     Hemoglobin A1c  Morbid obesity ([facility])  Osteoarthritis of both knees, unspecified osteoarthritis type Assessment & Plan: L>R knee pain has been limiting her ability to walk much.  Sleep has also been disrupted due to pain.    Plan: begin chair exercises and short walks around the house.  Continue to work on weight reduction.   Sleep-disordered breathing Assessment & Plan: Awaiting results of sleep study. Pt is high risk for OSA with sleep disordered breathing, BMI of 53 and poor sleep quality.   BMI 50.0-59.9,  adult ([place])  SOBOE (shortness of breath on exertion) Assessment & Plan: Reviewed IC test today. Awaiting sleep study results  Expected metabolic rate almost equals predicted metabolic rate at A999333 kcal/ day. She has been intaking 1600 kcal/ day and is losing weight.  Begin cat 3 meal plan Reviewed details of plan       She was informed of the importance of frequent follow up visits to maximize her success with intensive  lifestyle modifications for her multiple health conditions.   ATTESTASTION STATEMENTS:  Reviewed by clinician on day of visit: allergies, medications, problem list, medical history, surgical history, family history, social history, and previous encounter notes pertinent to obesity diagnosis.   I have personally spent 30 minutes total time today in preparation, patient care, nutritional counseling and documentation for this visit, including the following: review of clinical lab tests; review of medical tests/procedures/services.      Dell Ponto, DO DABFM, DABOM [facility] [address] [place], [place] [address] [phone]

## 2023-01-02 LAB — HEMOGLOBIN A1C
Est. average glucose Bld gHb Est-mCnc: 128 mg/dL
Hgb A1c MFr Bld: 6.1 % — ABNORMAL HIGH (ref 4.8–5.6)

## 2023-01-02 LAB — INSULIN, RANDOM: INSULIN: 22.8 u[IU]/mL (ref 2.6–24.9)

## 2023-01-02 LAB — VITAMIN D 25 HYDROXY (VIT D DEFICIENCY, FRACTURES): Vit D, 25-Hydroxy: 15.9 ng/mL — ABNORMAL LOW (ref 30.0–100.0)

## 2023-01-02 LAB — FOLATE: Folate: 6.5 ng/mL (ref >3.0)

## 2023-01-02 LAB — VITAMIN B12: Vitamin B-12: 315 pg/mL (ref 232–1245)

## 2023-01-02 LAB — TSH RFX ON ABNORMAL TO FREE T4: TSH: 2.51 u[IU]/mL (ref 0.450–4.500)

## 2023-01-22 ENCOUNTER — Ambulatory Visit (INDEPENDENT_AMBULATORY_CARE_PROVIDER_SITE_OTHER): Payer: BC Managed Care – PPO | Admitting: Family Medicine

## 2023-01-22 ENCOUNTER — Encounter (INDEPENDENT_AMBULATORY_CARE_PROVIDER_SITE_OTHER): Payer: Self-pay | Admitting: Family Medicine

## 2023-01-22 VITALS — BP 128/79 | HR 66 | Temp 97.7°F | Ht 64.0 in | Wt 310.0 lb

## 2023-01-22 DIAGNOSIS — R7303 Prediabetes: Secondary | ICD-10-CM

## 2023-01-22 DIAGNOSIS — E559 Vitamin D deficiency, unspecified: Secondary | ICD-10-CM

## 2023-01-22 DIAGNOSIS — M170 Bilateral primary osteoarthritis of knee: Secondary | ICD-10-CM | POA: Diagnosis not present

## 2023-01-22 DIAGNOSIS — G4730 Sleep apnea, unspecified: Secondary | ICD-10-CM | POA: Diagnosis not present

## 2023-01-22 DIAGNOSIS — Z6841 Body Mass Index (BMI) 40.0 and over, adult: Secondary | ICD-10-CM

## 2023-01-22 MED ORDER — METFORMIN HCL ER 750 MG PO TB24: 750.0000 mg | ORAL_TABLET | Freq: Every day | ORAL | 0 refills | Status: DC

## 2023-01-22 MED ORDER — VITAMIN D (ERGOCALCIFEROL) 1.25 MG (50000 UNIT) PO CAPS: 50000.0000 [IU] | ORAL_CAPSULE | ORAL | 0 refills | Status: DC

## 2023-01-22 NOTE — Assessment & Plan Note (Signed)
Lab Results  Component Value Date   HGBA1C 6.1 (H) 01/01/2023   We reviewed her labs from last visit.  Her A1c was in the prediabetic range at 6.1 and has been stable.  It has not improved on metformin XR 500 mg once daily, prescribed by her PCP.  She is tolerating metformin well without adverse side effect.  She has started reducing her intake of refined carbohydrates and starches.  She does have some cravings.  Plan: Continue working on dietary plan reducing intake of added sugar and refined carbohydrates.  Increase metformin XR to 750 mg once daily with food.

## 2023-01-22 NOTE — Assessment & Plan Note (Signed)
Bilateral knee DJD continues to be a hindrance to sleep at night and adding in additional walking.  She is able to do 5 to 10 minutes at a time every other day of the week.  She also needs weight loss in order to have TKR in the future.  She is looking into weight loss surgery as a means to lose weight and postpone her timeframe towards knee replacement surgery.

## 2023-01-22 NOTE — Progress Notes (Signed)
Office: [phone]  /  Fax: [phone]  WEIGHT SUMMARY AND BIOMETRICS  Starting Date: 12/19/22  Starting Weight: 316lb   Weight Lost Since Last Visit: 1lb   Vitals Temp: 97.7 F (36.5 C) BP: 128/79 Pulse Rate: 66 SpO2: 99 %   Body Composition  Body Fat %: 60.8 % Fat Mass (lbs): 188.4 lbs Muscle Mass (lbs): 115.4 lbs Visceral Fat Rating : 24   HPI  Chief Complaint: OBESITY  Shelby Clarke is here to discuss her progress with her obesity treatment plan. She is on the the Category 3 Plan and states she is following her eating plan approximately 100 % of the time. She states she is exercising 10-15 minutes 2 times per week.   Interval History:  Since last office visit she is down 1 lb She has a net weight loss of 6 pounds in the past 1 month of medically supervised weight management She is sticking to zero sugar drinks She had a few days of increased sugar cravings She has been eating more rice and nodles She has a net weight loss of 6 lb in the past month She is not skipping meals Awaiting results of sleep study DJD knees limits walking and wakes her up at night Able to walk 10-15 min every other day  She is considering going the route of weight loss surgery  Pharmacotherapy: None  PHYSICAL EXAM:  Blood pressure 128/79, pulse 66, temperature 97.7 F (36.5 C), height  (1.626 m), weight (!) 310 lb (140.6 kg), SpO2 99 %. Body mass index is 53.21 kg/m.  General: She is overweight, cooperative, alert, well developed, and in no acute distress. PSYCH: Has normal mood, affect and thought process.   Lungs: Normal breathing effort, no conversational dyspnea.   ASSESSMENT AND PLAN  TREATMENT PLAN FOR OBESITY:  Recommended Dietary Goals  Garnet is currently in the action stage of change. As such, her goal is to continue weight management plan. She has agreed to the Category 3 Plan.  Behavioral Intervention  We discussed the following Behavioral  Modification Strategies today: increasing lean protein intake, increasing vegetables, increasing lower glycemic fruits, increasing fiber rich foods, avoiding skipping meals, increasing water intake, work on meal planning and preparation, planning for success, and keeping healthy foods at home.  Additional resources provided today: NA  Recommended Physical Activity Goals  [name] has been advised to work up to 150 minutes of moderate intensity aerobic activity a week and strengthening exercises 2-3 times per week for cardiovascular health, weight loss maintenance and preservation of muscle mass.   She has agreed to Work on scheduling and tracking physical activity.   Pharmacotherapy changes for the treatment of obesity: None  ASSOCIATED CONDITIONS ADDRESSED TODAY  Pre-diabetes Assessment & Plan: Lab Results  Component Value Date   HGBA1C 6.1 (H) 01/01/2023   We reviewed her labs from last visit.  Her A1c was in the prediabetic range at 6.1 and has been stable.  It has not improved on metformin XR 500 mg once daily, prescribed by her PCP.  She is tolerating metformin well without adverse side effect.  She has started reducing her intake of refined carbohydrates and starches.  She does have some cravings.  Plan: Continue working on dietary plan reducing intake of added sugar and refined carbohydrates.  Increase metformin XR to 750 mg once daily with food.  Orders: -     metFORMIN HCl ER; Take 1 tablet (750 mg total) by mouth daily with breakfast.  Dispense: 90 tablet;  Refill: 0  Morbid obesity Assessment & Plan: Patient has had a long history of morbid obesity.  Her barriers to further weight loss include bilateral knee DJD affecting her ability to do added much exercise.  She has been slow to lose weight with creation of a caloric deficit.  She is looking at more of a long-term solution with weight loss surgery.  We discussed attending a virtual bariatric surgery seminar at [facility] surgery.  She will follow-up with me in 2 months to see where she is on the pathway to surgery.   BMI 50.0-59.9, adult  Vitamin D deficiency Assessment & Plan: New problem Reviewed labs from last visit  Last vitamin D Lab Results  Component Value Date   VD25OH 15.9 (L) 01/01/2023    We discussed vitamin D deficiency and its impact and leptin resistance, fatigue, osteoporosis and poor immune function.  We discussed a target vitamin D level 50-70.  Begin ergocalciferol 50,000 IU once weekly.  Recheck level in the next 3 to 4 months.  Orders: -     Vitamin D (Ergocalciferol); Take 1 capsule (50,000 Units total) by mouth every 7 (seven) days.  Dispense: 12 capsule; Refill: 0  Sleep-disordered breathing  Osteoarthritis of both knees, unspecified osteoarthritis type Assessment & Plan: Bilateral knee DJD continues to be a hindrance to sleep at night and adding in additional walking.  She is able to do 5 to 10 minutes at a time every other day of the week.  She also needs weight loss in order to have TKR in the future.  She is looking into weight loss surgery as a means to lose weight and postpone her timeframe towards knee replacement surgery.       She was informed of the importance of frequent follow up visits to maximize her success with intensive lifestyle modifications for her multiple health conditions.   ATTESTASTION STATEMENTS:  Reviewed by clinician on day of visit: allergies, medications, problem list, medical history, surgical history, family history, social history, and previous encounter notes pertinent to obesity diagnosis.   I have personally spent 30 minutes total time today in preparation, patient care, nutritional counseling and documentation for this visit, including the following: review of clinical lab tests; review of medical tests/procedures/services.      Glennis Brink, DO DABFM, DABOM [facility] [address]  [place], [place] [address] [phone]

## 2023-01-22 NOTE — Assessment & Plan Note (Signed)
New problem Reviewed labs from last visit  Last vitamin D Lab Results  Component Value Date   VD25OH 15.9 (L) 01/01/2023    We discussed vitamin D deficiency and its impact and leptin resistance, fatigue, osteoporosis and poor immune function.  We discussed a target vitamin D level 50-70.  Begin ergocalciferol 50,000 IU once weekly.  Recheck level in the next 3 to 4 months.

## 2023-01-22 NOTE — Assessment & Plan Note (Signed)
Patient has had a long history of morbid obesity.  Her barriers to further weight loss include bilateral knee DJD affecting her ability to do added much exercise.  She has been slow to lose weight with creation of a caloric deficit.  She is looking at more of a long-term solution with weight loss surgery.  We discussed attending a virtual bariatric surgery seminar at [facility] surgery.  She will follow-up with me in 2 months to see where she is on the pathway to surgery.

## 2023-01-23 ENCOUNTER — Encounter: Payer: Self-pay | Admitting: Neurology

## 2023-01-23 ENCOUNTER — Ambulatory Visit (INDEPENDENT_AMBULATORY_CARE_PROVIDER_SITE_OTHER): Payer: BC Managed Care – PPO | Admitting: Neurology

## 2023-01-23 VITALS — BP 150/80 | HR 68 | Ht 65.0 in | Wt 313.0 lb

## 2023-01-23 DIAGNOSIS — R519 Headache, unspecified: Secondary | ICD-10-CM | POA: Diagnosis not present

## 2023-01-23 DIAGNOSIS — G4701 Insomnia due to medical condition: Secondary | ICD-10-CM

## 2023-01-23 DIAGNOSIS — E662 Morbid (severe) obesity with alveolar hypoventilation: Secondary | ICD-10-CM | POA: Diagnosis not present

## 2023-01-23 DIAGNOSIS — G8929 Other chronic pain: Secondary | ICD-10-CM | POA: Insufficient documentation

## 2023-01-23 DIAGNOSIS — R0683 Snoring: Secondary | ICD-10-CM | POA: Diagnosis not present

## 2023-01-23 DIAGNOSIS — M6289 Other specified disorders of muscle: Secondary | ICD-10-CM

## 2023-01-23 NOTE — Progress Notes (Signed)
SLEEP MEDICINE CLINIC    Provider:  Melvyn Novas, MD  Primary Care Physician:  Pcp, No No address on file     Referring Provider: Mart Piggs [address] [place],  [place] [address]          Chief Complaint according to patient   Patient presents with:     New Patient (Initial Visit)      Patient in room #2 and alone. Patient states it hard for her to fall asleep but she would wake up in the middle of the night with pain in her leg, knee and foot.  Had a severe trauma and developed arthritis.  She has woken herself snoring as well.  Nocturia 2-3 times each night. Prediabetic on metformin. Had nocturnal chest pain last year, waking SOB, gasping, coughing.  Patient states she snores and she would stop breathing while sleeping.        HISTORY OF PRESENT ILLNESS:  Shelby Clarke is a 57 y.o. female patient who is seen upon referral on 01/23/2023 from dr Seymour Bars, DO for a Sleep Consultation.  Chief concern according to patient :  see above , Patient states it hard for her to fall asleep , being in pain, mind is racing-  she will wake up in the middle of the night with pain in her leg,  left knee and  right foot.  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  She has woken herself snoring as well.  Nocturia 2-3 times each night. Prediabetic on metformin. Had nocturnal chest pain last year, waking SOB, gasping, coughing.  Patient states she snores and she would stop breathing while sleeping.   I have the pleasure of seeing Shelby Clarke  on 01/23/23 .     Sleep relevant medical history: pain related chronic insomnia, obesity as a result of immobility, Nocturia up to 3 times, pain interrupts her sleep, her exercises, her ADL.    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    Family medical /sleep history: son with OSA, daughter with insomnia.   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  The patient  used to work in shifts( night/ rotating,) 30 plus years ago.  Tobacco use; none . Does not live with a smoker.   ETOH use ; none . Caffeine intake in form of Coffee( 1 cup weekly) Soda( cutting down) Tea ( cutting down on sweet tea) or energy drinks Exercise is limited.         Sleep habits are as follows: The patient's dinner time is between 7-8 PM. The patient goes to bed at 12 PM and takes longer than 60 minutes to go to sleep- then continues to have fragmented sleep for a total of 4-5 hours, wakes for  bathroom breaks, woken by snoring, by pain-   The preferred sleep position is variable - restless, with the support of 1-3 pillows. She likes prone sleep-' Dreams are reportedly rare.   The patient wakes up at 6.30 with an alarm. Granddaughter goes to school , she gets back to sleep by 8.30-and then 11.30 AM is the usual rise time. She reports not feeling refreshed or restored in AM, with symptoms such as dry mouth, morning headaches coughing, , and residual fatigue.  Naps are taken frequently, lasting from 60-90 minutes and are interfering with nocturnal sleep.    Review of Systems: Out of a complete 14 system review, the patient complains of only the following symptoms, and all other reviewed systems are negative.:  Fatigue, sleepiness , snoring, fragmented sleep due to snoring, choking, pain and coughing.   Trazodone  causes grogginess-     Insomnia due to pain, depression, anxiety.    How likely are you to doze in the following situations: 0 = not likely, 1 = slight chance, 2 = moderate chance, 3 = high chance   Sitting and Reading? Watching Television? Sitting inactive in a public place (theater or meeting)? As a passenger in a car for an hour without a break? Lying down in the afternoon when circumstances permit? Sitting and talking to  someone? Sitting quietly after lunch without alcohol? In a car, while stopped for a few minutes in traffic?   Total = 14-15/ 24 points   FSS endorsed at 52/ 63 points.   GDS - 5/ 15     Social History   Socioeconomic History   Marital status: Married    Spouse name: Not on file   Number of children: Not on file   Years of education: Not on file   Highest education level: Not on file  Occupational History   Not on file  Tobacco Use   Smoking status: Never   Smokeless tobacco: Never  Substance and Sexual Activity   Alcohol use: Not Currently   Drug use: Not Currently   Sexual activity: Not on file  Other Topics Concern   Not on file  Social History Narrative   Not on file   Social Determinants of Health   Financial Resource Strain: Not on file  Food Insecurity: Not on file  Transportation Needs: Not on file  Physical Activity: Not on file  Stress: Not on file  Social Connections: Not on file    Family History  Problem Relation Age of Onset   Hypertension Mother    Obesity Mother    Diabetes Father    Alcohol abuse Father     Past Medical History:  Diagnosis Date   Back pain    Chest pain    Constipation    GERD (gastroesophageal reflux disease)    Hypertension    IBS (irritable bowel syndrome)    Pre-diabetes    SOB (shortness of breath)    Thyroid disease     Past Surgical History:  Procedure Laterality Date   COLONOSCOPY WITH PROPOFOL N/A 03/27/2021   Procedure: COLONOSCOPY WITH PROPOFOL;  Surgeon: Toney Reil, MD;  Location: [facility] ENDOSCOPY;  Service: Gastroenterology;  Laterality: N/A;   ESOPHAGOGASTRODUODENOSCOPY (EGD) WITH PROPOFOL N/A 03/27/2021   Procedure: ESOPHAGOGASTRODUODENOSCOPY (EGD) WITH PROPOFOL;  Surgeon: Toney Reil, MD;  Location[facility] ENDOSCOPY;  Service: Gastroenterology;  Laterality: N/A;   FOOT SURGERY       Current Outpatient Medications on File Prior to Visit  Medication Sig Dispense Refill   ACETAMINOPHEN  PO Take 200 mg by mouth as needed. Tylenol     diclofenac Sodium (VOLTAREN) 1 % GEL SMARTSIG:2 Gram(s) Topical 3 Times Daily PRN     famotidine (PEPCID) 40 MG tablet Take 40 mg by mouth daily.     levothyroxine (SYNTHROID) 50 MCG tablet TAKE 1 TABLET BY MOUTH ONCE DAILY FOR HYPOTHYROIDISM     lisinopril (ZESTRIL) 5 MG tablet Take 1 tablet by mouth daily.     loratadine (CLARITIN)  10 MG tablet      meloxicam (MOBIC) 15 MG tablet TAKE 1 TABLET BY MOUTH ONCE DAILY FOR PAIN     metFORMIN (GLUCOPHAGE-XR) 750 MG 24 hr tablet Take 1 tablet (750 mg total) by mouth daily with breakfast. 90 tablet 0   omeprazole (PRILOSEC) 20 MG capsule Take 1 capsule by mouth daily.     Vitamin D, Ergocalciferol, (DRISDOL) 1.25 MG (50000 UNIT) CAPS capsule Take 1 capsule (50,000 Units total) by mouth every 7 (seven) days. 12 capsule 0   ASPIRIN 81 PO Take 81 mg by mouth daily. (Patient not taking: Reported on 01/23/2023)     furosemide (LASIX) 20 MG tablet Take 1 tablet (20 mg total) by mouth daily. (Patient not taking: Reported on 01/23/2023) 30 tablet 1   traZODone (DESYREL) 50 MG tablet TAKE 1 TABLET BY MOUTH NIGHTLY AT BEDTIME (Patient not taking: Reported on 01/23/2023)     No current facility-administered medications on file prior to visit.    No Known Allergies   DIAGNOSTIC DATA (LABS, IMAGING, TESTING) - I reviewed patient records, labs, notes, testing and imaging myself where available.  Lab Results  Component Value Date   WBC 4.8 05/27/2022   HGB 12.0 05/27/2022   HCT 42.2 05/27/2022   MCV 97.7 05/27/2022   PLT 199 05/27/2022      Component Value Date/Time   NA 137 05/27/2022 1509   NA 138 05/05/2014 2049   K 4.2 05/27/2022 1509   K 3.9 05/05/2014 2049   CL 106 05/27/2022 1509   CL 105 05/05/2014 2049   CO2 21 (L) 05/27/2022 1509   CO2 28 05/05/2014 2049   GLUCOSE 111 (H) 05/27/2022 1509   GLUCOSE 90 05/05/2014 2049   BUN 19 05/27/2022 1509   BUN 13 05/05/2014 2049   CREATININE 0.67  05/27/2022 1509   CREATININE 0.68 05/05/2014 2049   CALCIUM 9.3 05/27/2022 1509   CALCIUM 8.7 05/05/2014 2049   PROT 7.0 02/02/2019 0437   PROT 7.6 05/05/2014 2049   ALBUMIN 4.1 02/02/2019 0437   ALBUMIN 3.6 05/05/2014 2049   AST 19 02/02/2019 0437   AST 15 05/05/2014 2049   ALT 21 02/02/2019 0437   ALT 30 05/05/2014 2049   ALKPHOS 69 02/02/2019 0437   ALKPHOS 75 05/05/2014 2049   BILITOT 0.4 02/02/2019 0437   BILITOT 0.2 05/05/2014 2049   GFRNONAA >60 05/27/2022 1509   GFRNONAA >60 05/05/2014 2049   GFRAA >60 02/02/2019 0437   GFRAA >60 05/05/2014 2049   Lab Results  Component Value Date   CHOL 209 (H) 06/28/2022   HDL 50 06/28/2022   LDLCALC 137 (H) 06/28/2022   TRIG 122 06/28/2022   CHOLHDL 4.2 06/28/2022   Lab Results  Component Value Date   HGBA1C 6.1 (H) 01/01/2023   Lab Results  Component Value Date   VITAMINB12 315 01/01/2023   Lab Results  Component Value Date   TSH 2.510 01/01/2023    PHYSICAL EXAM:  Today's Vitals   01/23/23 1059  BP: (!) 150/80  Pulse: 68  Weight: (!) 313 lb (142 kg)  Height: 5\' 5"  (1.651 m)   Body mass index is 52.09 kg/m.   Wt Readings from Last 3 Encounters:  01/23/23 (!) 313 lb (142 kg)  01/22/23 (!) 310 lb (140.6 kg)  01/01/23 (!) 311 lb (141.1 kg)     Ht Readings from Last 3 Encounters:  01/23/23 5\' 5"  (1.651 m)  01/22/23 5\' 4"  (1.626 m)  01/01/23 5\' 4"  (1.626 m)  General: The patient is awake, alert and appears not in acute distress. The patient is well groomed. Head: Normocephalic, atraumatic.  Neck is supple. Mallampati 2,  neck circumference:16 inches . Nasal airflow barely  patent. Seasonal rhinitis- sinuitis.  Retrognathia is not seen.  Dental status: biological  Cardiovascular:  Regular rate and cardiac rhythm by pulse,  without distended neck veins. Respiratory: Lungs are clear to auscultation.  Skin:  With evidence of ankle edema,  Trunk: The patient's posture is erect.   NEUROLOGIC EXAM: The  patient is awake and alert, oriented to place and time.   Memory subjective described as intact.  Attention span & concentration ability appears normal.  Speech is fluent,  with mld  dysphonia .  Cranial nerves: no loss of smell or taste reported  Pupils are equal and briskly reactive to light. Funduscopic exam deferred..  Extraocular movements in vertical and horizontal planes were intact and without nystagmus. No Diplopia. Visual fields by finger perimetry are intact. Hearing was intact to soft voice and finger rubbing.    Facial sensation intact to fine touch.  Facial motor strength is symmetric and tongue and uvula move midline.  Neck ROM : rotation, tilt and flexion extension were normal for age and shoulder shrug was symmetrical.    Motor exam:  Symmetric bulk, tone and ROM.   Normal tone without cog wheeling, symmetric grip strength .   Sensory:  Fine touch,  and vibration were normal.  Proprioception tested in the upper extremities was normal.   Coordination: Rapid alternating movements in the fingers/hands were of normal speed.  The Finger-to-nose maneuver was intact without evidence of ataxia, dysmetria or tremor.   Gait and station: Patient could rise assisted from a seated position, needs to brace herself-  walked without assistive device, but wide based, arthralgia,  leaning to the left.    Deep tendon reflexes: in the  upper extremities are symmetric and intact.  Severe ankle edema prevents achilles tendon reflex.  Babinski response was deferred .    ASSESSMENT AND PLAN:  57 y.o. year old female  here with:Insomnia, pain, and high risk for OSA based on BMI, neck size and symptoms.    low muscle tone ( (core weakness and abdominal girth causing hypoventilation) and she is deconditioned.     1) Insomnia of various causes: Chronic joint pain. Chronic pain insomnia is treated by treating pain-   2) Mind is racing, worries-  this is psychological insomnia. Trazodone has  not helped as much, may need cognitive behavior therapy.    3)OSA ?  snoring and choking , waking up out of sleep, this component would be organic and can indicate OSA to be present. We will check for this -  also has sleep related headaches.   The patient should be evaluated in lab, I will order a sleep study SPLIT protocol, AHI 10/h and mask fit in house. May need oxygen too.  I assured her we can wait for this test to be done  in 8 weeks, not urgent.   Plan B: HST    I plan to follow up either personally or through our NP within 4-5 months.   I would like to thank  Cathey Endow Lindley Magnus [address] [place],  [place] [address] for allowing me to meet with and to take care of this pleasant patient.    After spending a total time of  45  minutes face to face and additional time for physical and neurologic  examination, review of laboratory studies,  personal review of imaging studies, reports and results of other testing and review of referral information / records as far as provided in visit,   Electronically signed by: Melvyn Novas, MD 01/23/2023 11:17 AM  [facility] Board certified by The [employer] of Sleep Medicine and Diplomate of the [employer] of Sleep Medicine. Board certified In Neurology through the ABPN, Fellow of the [employer] of Neurology. Medical Director of [facility].

## 2023-01-23 NOTE — Patient Instructions (Addendum)
    ASSESSMENT AND PLAN:  57 y.o. year old female  here with:Insomnia, pain, and high risk for OSA based on BMI, neck size and symptoms.    low muscle tone ( (core weakness and abdominal girth causing hypoventilation) and she is deconditioned.     1) Insomnia of various causes: Chronic joint pain. Chronic pain insomnia is treated by treating pain-   2) Mind is racing, worries-  this is psychological insomnia. Trazodone has not helped as much, may need cognitive behavior therapy.    3)OSA ?  snoring and choking , waking up out of sleep, this component would be organic and can indicate OSA to be present. We will check for this -  also has sleep related headaches.   The patient should be evaluated in lab, I will order a sleep study SPLIT protocol, AHI 10/h and mask fit in house. May need oxygen too.   Plan B: HST

## 2023-01-24 ENCOUNTER — Ambulatory Visit (INDEPENDENT_AMBULATORY_CARE_PROVIDER_SITE_OTHER): Payer: BC Managed Care – PPO | Admitting: Family Medicine

## 2023-02-06 ENCOUNTER — Encounter: Payer: Self-pay | Admitting: Neurology

## 2023-03-19 ENCOUNTER — Ambulatory Visit (INDEPENDENT_AMBULATORY_CARE_PROVIDER_SITE_OTHER): Payer: BC Managed Care – PPO | Admitting: Family Medicine

## 2023-03-26 ENCOUNTER — Ambulatory Visit (INDEPENDENT_AMBULATORY_CARE_PROVIDER_SITE_OTHER): Payer: BC Managed Care – PPO | Admitting: Family Medicine

## 2023-03-28 ENCOUNTER — Ambulatory Visit (INDEPENDENT_AMBULATORY_CARE_PROVIDER_SITE_OTHER): Payer: BC Managed Care – PPO | Admitting: Family Medicine

## 2023-03-28 VITALS — BP 124/84 | HR 64 | Temp 98.6°F | Ht 64.0 in | Wt 293.0 lb

## 2023-03-28 DIAGNOSIS — E559 Vitamin D deficiency, unspecified: Secondary | ICD-10-CM

## 2023-03-28 DIAGNOSIS — R7303 Prediabetes: Secondary | ICD-10-CM | POA: Diagnosis not present

## 2023-03-28 DIAGNOSIS — M170 Bilateral primary osteoarthritis of knee: Secondary | ICD-10-CM

## 2023-03-28 DIAGNOSIS — Z6841 Body Mass Index (BMI) 40.0 and over, adult: Secondary | ICD-10-CM

## 2023-03-28 MED ORDER — METFORMIN HCL ER 750 MG PO TB24: 750.0000 mg | ORAL_TABLET | Freq: Every day | ORAL | 0 refills | Status: DC

## 2023-03-28 MED ORDER — VITAMIN D (ERGOCALCIFEROL) 1.25 MG (50000 UNIT) PO CAPS: 50000.0000 [IU] | ORAL_CAPSULE | ORAL | 0 refills | Status: DC

## 2023-03-28 NOTE — Assessment & Plan Note (Signed)
DJD pain in both knees has limited her ability to walk much for exercise.  She is seeing slight improvements in pain with weight loss.  She plans on doing some water exercise over the summer months.  She has been able to add in some strength training and body weight training.  Continue to work on weight reduction for future TKR.  Recommend water exercise 2-3 times a week.

## 2023-03-28 NOTE — Assessment & Plan Note (Signed)
Reviewed bioimpedence results Doing great building lean muscle mass and reducing body fat Doing great with dietary logging Notes reviewed from [facility] Bariatric Surgery team - pt is unsure she will proceed with surgery due to cost and seeing weight loss now She has lost a net 23 pounds in the past 3 months of medically supervised weight management  this is a 7.2% total body weight loss.  Continue category 3 meal plan or logging of daily caloric intake averaging 1300 to 1600 cal/day.  This should include 100 to 110 g of protein intake daily.  Continue to ramp up exercise aiming for 30 minutes 5 days a week both cardio and resistance training.

## 2023-03-28 NOTE — Assessment & Plan Note (Signed)
Last vitamin D Lab Results  Component Value Date   VD25OH 15.9 (L) 01/01/2023   Doing well on prescription ergocalciferol weekly without adverse side effect.  Energy level has improved.  Recheck vitamin D level next visit.

## 2023-03-28 NOTE — Assessment & Plan Note (Signed)
Lab Results  Component Value Date   HGBA1C 6.1 (H) 01/01/2023   She has actively been working on reducing her intake of added sugar and refined carbohydrates.  She has lost 7.2% total body weight in the last 3 months of medically supervised weight management.  She has successfully cut out sugar sweetened beverages and increase her walking time.  She is doing well on metformin XR 750 mg once daily.  She denies adverse side effects.  Continue current plan.  Recheck chemistry panel, A1c, B12 next visit.

## 2023-03-28 NOTE — Progress Notes (Signed)
Office: [phone]  /  Fax: [phone]  WEIGHT SUMMARY AND BIOMETRICS  Starting Date: 12/18/24  Starting Weight: 316lb   Weight Lost Since Last Visit: 17lb   Vitals Temp: 98.6 F (37 C) BP: 124/84 Pulse Rate: 64 SpO2: 98 %   Body Composition  Body Fat %: 53.3 % Fat Mass (lbs): 156.2 lbs Muscle Mass (lbs): 130 lbs Total Body Water (lbs): 103.4 lbs Visceral Fat Rating : 20     HPI  Chief Complaint: OBESITY  Shelby Clarke is here to discuss her progress with her obesity treatment plan. She is on the Category 3 plan and states she is following her eating plan approximately 90 % of the time. She states she is trying to walk more daily.    Interval History:  Since last office visit she is down 17 lb She is sticking to her cat 3 meal plan much better- 90% of the time Her satiety has much improved with increased protein intake She has cut out high sugar foods and drinks She has been seen by [facility] for [facility] but isn't sure she will procedure done due to cost She is able to walk more and her energy level has improved Walking is limited due to bilat knee DJD pain She is down 23 lb in the past 3 mos This is a 7.2% total body weight loss She is is logging her intake getting in 1300-1600 cal/ day, getting in ~100 g of protein intake daily Hunger and craving under good control  Pharmacotherapy: Metformin XR 750 mg once daily for prediabetes  PHYSICAL EXAM:  Blood pressure 124/84, pulse 64, temperature 98.6 F (37 C), height 5\' 4"  (1.626 m), weight 293 lb (132.9 kg), SpO2 98 %. Body mass index is 50.29 kg/m.  General: She is overweight, cooperative, alert, well developed, and in no acute distress. PSYCH: Has normal mood, affect and thought process.   Lungs: Normal breathing effort, no conversational dyspnea.   ASSESSMENT AND PLAN  TREATMENT PLAN FOR OBESITY:  Recommended Dietary Goals  [name] is currently in the action stage of change. As such, her  goal is to continue weight management plan. She has agreed to keeping a food journal and adhering to recommended goals of 1300-1600 calories and 100+ grams protein.  Behavioral Intervention  We discussed the following Behavioral Modification Strategies today: increasing lean protein intake, decreasing simple carbohydrates , increasing vegetables, increasing lower glycemic fruits, avoiding skipping meals, increasing water intake, work on meal planning and preparation, continue to work on implementation of reduced calorie nutritional plan, continue to practice mindfulness when eating, and planning for success.  Additional resources provided today: NA  Recommended Physical Activity Goals  [name] has been advised to work up to 150 minutes of moderate intensity aerobic activity a week and strengthening exercises 2-3 times per week for cardiovascular health, weight loss maintenance and preservation of muscle mass.   She has agreed to [employer] strengthening exercises with a goal of 2-3 sessions a week   Pharmacotherapy changes for the treatment of obesity: None  ASSOCIATED CONDITIONS ADDRESSED TODAY  Pre-diabetes Assessment & Plan: Lab Results  Component Value Date   HGBA1C 6.1 (H) 01/01/2023   She has actively been working on reducing her intake of added sugar and refined carbohydrates.  She has lost 7.2% total body weight in the last 3 months of medically supervised weight management.  She has successfully cut out sugar sweetened beverages and increase her walking time.  She is doing well on metformin XR  750 mg once daily.  She denies adverse side effects.  Continue current plan.  Recheck chemistry panel, A1c, B12 next visit.    Orders: -     metFORMIN HCl ER; Take 1 tablet (750 mg total) by mouth daily with breakfast.  Dispense: 90 tablet; Refill: 0  Vitamin D deficiency Assessment & Plan: Last vitamin D Lab Results  Component Value Date   VD25OH 15.9 (L) 01/01/2023   Doing well on  prescription ergocalciferol weekly without adverse side effect.  Energy level has improved.  Recheck vitamin D level next visit.  Orders: -     Vitamin D (Ergocalciferol); Take 1 capsule (50,000 Units total) by mouth every 7 (seven) days.  Dispense: 12 capsule; Refill: 0  Morbid obesity (HCC) with starting BMI 54 Assessment & Plan: Reviewed bioimpedence results Doing great building lean muscle mass and reducing body fat Doing great with dietary logging Notes reviewed from [facility] Bariatric Surgery team - pt is unsure she will proceed with surgery due to cost and seeing weight loss now She has lost a net 23 pounds in the past 3 months of medically supervised weight management  this is a 7.2% total body weight loss.  Continue category 3 meal plan or logging of daily caloric intake averaging 1300 to 1600 cal/day.  This should include 100 to 110 g of protein intake daily.  Continue to ramp up exercise aiming for 30 minutes 5 days a week both cardio and resistance training.    BMI 50.0-59.9, adult (HCC)  Primary osteoarthritis of both knees Assessment & Plan: DJD pain in both knees has limited her ability to walk much for exercise.  She is seeing slight improvements in pain with weight loss.  She plans on doing some water exercise over the summer months.  She has been able to add in some strength training and body weight training.  Continue to work on weight reduction for future TKR.  Recommend water exercise 2-3 times a week.       She was informed of the importance of frequent follow up visits to maximize her success with intensive lifestyle modifications for her multiple health conditions.   ATTESTASTION STATEMENTS:  Reviewed by clinician on day of visit: allergies, medications, problem list, medical history, surgical history, family history, social history, and previous encounter notes pertinent to obesity diagnosis.   I have personally spent 30 minutes total time today in  preparation, patient care, nutritional counseling and documentation for this visit, including the following: review of clinical lab tests; review of medical tests/procedures/services.      Glennis Brink, DO DABFM, DABOM [facility] [address] [place], [place] [address] [phone]

## 2023-05-02 ENCOUNTER — Ambulatory Visit (INDEPENDENT_AMBULATORY_CARE_PROVIDER_SITE_OTHER): Payer: BC Managed Care – PPO | Admitting: Family Medicine

## 2023-05-02 ENCOUNTER — Encounter (INDEPENDENT_AMBULATORY_CARE_PROVIDER_SITE_OTHER): Payer: Self-pay | Admitting: Family Medicine

## 2023-05-02 VITALS — BP 116/76 | HR 59 | Temp 97.7°F | Ht 64.0 in | Wt 283.0 lb

## 2023-05-02 DIAGNOSIS — R7303 Prediabetes: Secondary | ICD-10-CM | POA: Diagnosis not present

## 2023-05-02 DIAGNOSIS — E559 Vitamin D deficiency, unspecified: Secondary | ICD-10-CM

## 2023-05-02 DIAGNOSIS — Z6841 Body Mass Index (BMI) 40.0 and over, adult: Secondary | ICD-10-CM

## 2023-05-02 DIAGNOSIS — M170 Bilateral primary osteoarthritis of knee: Secondary | ICD-10-CM

## 2023-05-02 DIAGNOSIS — R5383 Other fatigue: Secondary | ICD-10-CM | POA: Diagnosis not present

## 2023-05-02 DIAGNOSIS — I10 Essential (primary) hypertension: Secondary | ICD-10-CM

## 2023-05-02 DIAGNOSIS — E038 Other specified hypothyroidism: Secondary | ICD-10-CM

## 2023-05-02 MED ORDER — VITAMIN D (ERGOCALCIFEROL) 1.25 MG (50000 UNIT) PO CAPS
50000.0000 [IU] | ORAL_CAPSULE | ORAL | 0 refills | Status: DC
Start: 2023-05-02 — End: 2023-07-04

## 2023-05-02 MED ORDER — LEVOTHYROXINE SODIUM 50 MCG PO TABS
50.0000 ug | ORAL_TABLET | Freq: Every day | ORAL | 0 refills | Status: DC
Start: 2023-05-02 — End: 2023-07-04

## 2023-05-02 MED ORDER — LISINOPRIL 5 MG PO TABS
5.0000 mg | ORAL_TABLET | Freq: Every day | ORAL | 0 refills | Status: DC
Start: 2023-05-02 — End: 2023-07-04

## 2023-05-02 NOTE — Assessment & Plan Note (Signed)
Last vitamin D Lab Results  Component Value Date   VD25OH 15.9 (L) 01/01/2023   Doing well on RX vitamin D weekly Energy level is improving Denies adverse SE  Recheck level today

## 2023-05-02 NOTE — Assessment & Plan Note (Signed)
Bilat knee DJD- able to walk more lately with weight loss and is using Meloxicam prn pain.  Working on BMI reduction to have total knee arthroplasty of both.  Reminded her to wear proper sneakers and try moving walks to a softer outdoor surface like a dirt trail at the park.

## 2023-05-02 NOTE — Progress Notes (Signed)
Office: [phone]  /  Fax: [phone]  WEIGHT SUMMARY AND BIOMETRICS  Starting Date: 12/19/22  Starting Weight: 316lb   Weight Lost Since Last Visit: 10lb   Vitals Temp: 97.7 F (36.5 C) BP: 116/76 Pulse Rate: (!) 59 SpO2: 100 %   Body Composition  Body Fat %: 58.8 % Fat Mass (lbs): 166.6 lbs Muscle Mass (lbs): 110.8 lbs Visceral Fat Rating : 21     HPI  Chief Complaint: OBESITY  Shelby Clarke is here to discuss her progress with her obesity treatment plan. She is on the keeping a food journal and adhering to recommended goals of 1600 calories and 110-120 protein and states she is following her eating plan approximately 90 % of the time. She states she is exercising 30 minutes 4 times per week.   Interval History:  Since last office visit she is down 10 lb She is logging her intake on the L[employer] It Ap She has occasional hunger if she has not gotten in enough protein-- adds in a high protein snack Her husband is also working on weight loss Her granddaughter is eating dinner with them Energy level has improved She is walking more but her bilateral knee DJD  pain has limited her She has a net weight loss of 33 lb in the past 4 mos of medically supervised weight management This is a 10.4% TBW loss without use of anti obesity medication in 4 mos  Pharmacotherapy: metformin XR 750 mg daily   PHYSICAL EXAM:  Blood pressure 116/76, pulse (!) 59, temperature 97.7 F (36.5 C), height 5\' 4"  (1.626 m), weight 283 lb (128.4 kg), SpO2 100%. Body mass index is 48.58 kg/m.  General: She is overweight, cooperative, alert, well developed, and in no acute distress. PSYCH: Has normal mood, affect and thought process.   Lungs: Normal breathing effort, no conversational dyspnea.   ASSESSMENT AND PLAN  TREATMENT PLAN FOR OBESITY:  Recommended Dietary Goals  Shelby Clarke is currently in the action stage of change. As such, her goal is to continue weight management plan. She has  agreed to keeping a food journal and adhering to recommended goals of 1600 calories and 110 g of  protein.  Behavioral Intervention  We discussed the following Behavioral Modification Strategies today: increasing lean protein intake, decreasing simple carbohydrates , increasing vegetables, increasing lower glycemic fruits, increasing water intake, work on meal planning and preparation, keeping healthy foods at home, work on managing stress, creating time for self-care and relaxation measures, avoiding temptations and identifying enticing environmental cues, continue to practice mindfulness when eating, and planning for success.  Additional resources provided today: NA  Recommended Physical Activity Goals  Shelby Clarke has been advised to work up to 150 minutes of moderate intensity aerobic activity a week and strengthening exercises 2-3 times per week for cardiovascular health, weight loss maintenance and preservation of muscle mass.   She has agreed to Increase the intensity, frequency or duration of aerobic exercises    Pharmacotherapy changes for the treatment of obesity: none  ASSOCIATED CONDITIONS ADDRESSED TODAY  Pre-diabetes Assessment & Plan: Lab Results  Component Value Date   HGBA1C 6.1 (H) 01/01/2023   Doing well on metformin XR 750 mg once daily with food Denies adverse SE Has increased walking time and has lost >10% TBW in 4 mos of medically supervised weight management  Continue metformin XR 750 mg daily and continue to work on healthy lifestyle changes Update labs today  Orders: -     Insulin, random -  Hemoglobin A1c  Primary osteoarthritis of both knees Assessment & Plan: Bilat knee DJD- able to walk more lately with weight loss and is using Meloxicam prn pain.  Working on BMI reduction to have total knee arthroplasty of both.  Reminded her to wear proper sneakers and try moving walks to a softer outdoor surface like a dirt trail at the park.   Morbid obesity  (HCC) with starting BMI 54  BMI 45.0-49.9, adult (HCC)  Vitamin D deficiency Assessment & Plan: Last vitamin D Lab Results  Component Value Date   VD25OH 15.9 (L) 01/01/2023   Doing well on RX vitamin D weekly Energy level is improving Denies adverse SE  Recheck level today  Orders: -     VITAMIN D 25 Hydroxy (Vit-D Deficiency, Fractures) -     Vitamin D (Ergocalciferol); Take 1 capsule (50,000 Units total) by mouth every 7 (seven) days.  Dispense: 12 capsule; Refill: 0  Other fatigue -     Vitamin B12 -     Comprehensive metabolic panel  Essential hypertension -     Lipid panel -     Lisinopril; Take 1 tablet (5 mg total) by mouth daily.  Dispense: 90 tablet; Refill: 0  Other specified hypothyroidism -     Levothyroxine Sodium; Take 1 tablet (50 mcg total) by mouth daily before breakfast.  Dispense: 90 tablet; Refill: 0      She was informed of the importance of frequent follow up visits to maximize her success with intensive lifestyle modifications for her multiple health conditions.   ATTESTASTION STATEMENTS:  Reviewed by clinician on day of visit: allergies, medications, problem list, medical history, surgical history, family history, social history, and previous encounter notes pertinent to obesity diagnosis.   I have personally spent 30 minutes total time today in preparation, patient care, nutritional counseling and documentation for this visit, including the following: review of clinical lab tests; review of medical tests/procedures/services.      Glennis Brink, DO DABFM, DABOM [facility] [address] [place], [place] [address] [phone]

## 2023-05-02 NOTE — Assessment & Plan Note (Signed)
Lab Results  Component Value Date   HGBA1C 6.1 (H) 01/01/2023   Doing well on metformin XR 750 mg once daily with food Denies adverse SE Has increased walking time and has lost >10% TBW in 4 mos of medically supervised weight management  Continue metformin XR 750 mg daily and continue to work on healthy lifestyle changes Update labs today

## 2023-05-30 ENCOUNTER — Encounter (INDEPENDENT_AMBULATORY_CARE_PROVIDER_SITE_OTHER): Payer: Self-pay | Admitting: Family Medicine

## 2023-05-30 ENCOUNTER — Ambulatory Visit (INDEPENDENT_AMBULATORY_CARE_PROVIDER_SITE_OTHER): Payer: BC Managed Care – PPO | Admitting: Family Medicine

## 2023-05-30 VITALS — BP 115/75 | HR 67 | Temp 97.9°F | Ht 64.0 in | Wt 278.0 lb

## 2023-05-30 DIAGNOSIS — R7303 Prediabetes: Secondary | ICD-10-CM

## 2023-05-30 DIAGNOSIS — Z6841 Body Mass Index (BMI) 40.0 and over, adult: Secondary | ICD-10-CM | POA: Diagnosis not present

## 2023-05-30 DIAGNOSIS — E559 Vitamin D deficiency, unspecified: Secondary | ICD-10-CM

## 2023-05-30 DIAGNOSIS — E669 Obesity, unspecified: Secondary | ICD-10-CM | POA: Diagnosis not present

## 2023-05-30 MED ORDER — METFORMIN HCL ER 750 MG PO TB24
750.0000 mg | ORAL_TABLET | Freq: Every day | ORAL | 0 refills | Status: DC
Start: 2023-05-30 — End: 2023-09-11

## 2023-05-30 NOTE — Progress Notes (Signed)
Chief Complaint:   OBESITY Shelby Clarke is here to discuss her progress with her obesity treatment plan along with follow-up of her obesity related diagnoses. Shelby Clarke is on keeping a food journal and adhering to recommended goals of 1600 calories and 110+ grams of protein and states she is following her eating plan approximately 100% of the time. Shelby Clarke states she is doing some walking.  Today's visit was #: 6 Starting weight: 316 lbs Starting date: 12/19/2022 Today's weight: 278 lbs Today's date: 05/30/2023 Total lbs lost to date: 38 Total lbs lost since last in-office visit: 5  Interim History: Patient is a patient of Dr. Ovidio Kin and today is her first visit with me. She went to [facility] for bariatric surgery evaluation and decided to do see what she could on her own first. She did have concerns about some of the long term affects of bariatric surgery.  She is really enjoying journaling and learning the nutrition of the foods she is taking in.  She is feeling better and feels like she has more energy than she did previously.  She is getting up and doing more. No plans for Labor Day weekend yet; [place]. She is averaging around 1400-1500 calories a day.   Subjective:   1. Pre-diabetes Patient's A1c at the end of July was 5.6.  She is on metformin daily with no GI side effects.  I discussed labs with the patient today.  2. Vitamin D deficiency Patient is on prescription vitamin D.  Her vitamin D level increased from 15.9-36.8.  I discussed labs with the patient today.  Assessment/Plan:   1. Pre-diabetes We will refill metformin 750 mg once daily for 90 days.  - metFORMIN (GLUCOPHAGE-XR) 750 MG 24 hr tablet; Take 1 tablet (750 mg total) by mouth daily with breakfast.  Dispense: 90 tablet; Refill: 0  2. Vitamin D deficiency Patient will continue prescription vitamin D.  3. BMI 45.0-49.9, adult (HCC)  4. Obesity with starting BMI of 54.7 Shelby Clarke is currently in  the action stage of change. As such, her goal is to continue with weight loss efforts. She has agreed to keeping a food journal and adhering to recommended goals of 1600 calories and 110+ grams of protein daily.   Exercise goals: Patient is to start making plans for 15 to 20 minutes of exercise 3-4 times per week.  Behavioral modification strategies: increasing lean protein intake, meal planning and cooking strategies, keeping healthy foods in the home, and planning for success.  Shelby Clarke has agreed to follow-up with our clinic in 4 weeks. She was informed of the importance of frequent follow-up visits to maximize her success with intensive lifestyle modifications for her multiple health conditions.   Objective:   Blood pressure 115/75, pulse 67, temperature 97.9 F (36.6 C), height 5\' 4"  (1.626 m), weight 278 lb (126.1 kg), SpO2 98%. Body mass index is 47.72 kg/m.  General: Cooperative, alert, well developed, in no acute distress. HEENT: Conjunctivae and lids unremarkable. Cardiovascular: Regular rhythm.  Lungs: Normal work of breathing. Neurologic: No focal deficits.   Lab Results  Component Value Date   CREATININE 0.53 (L) 05/02/2023   BUN 21 05/02/2023   NA 140 05/02/2023   K 5.0 05/02/2023   CL 104 05/02/2023   CO2 24 05/02/2023   Lab Results  Component Value Date   ALT 30 05/02/2023   AST 24 05/02/2023   ALKPHOS 67 05/02/2023   BILITOT 0.3 05/02/2023   Lab  Results  Component Value Date   HGBA1C 5.6 05/02/2023   HGBA1C 6.1 (H) 01/01/2023   HGBA1C 6.0 (H) 06/28/2022   Lab Results  Component Value Date   INSULIN 12.8 05/02/2023   INSULIN 22.8 01/01/2023   Lab Results  Component Value Date   TSH 2.510 01/01/2023   Lab Results  Component Value Date   CHOL 151 05/02/2023   HDL 42 05/02/2023   LDLCALC 92 05/02/2023   TRIG 91 05/02/2023   CHOLHDL 3.6 05/02/2023   Lab Results  Component Value Date   VD25OH 36.8 05/02/2023   VD25OH 15.9 (L) 01/01/2023   Lab  Results  Component Value Date   WBC 4.8 05/27/2022   HGB 12.0 05/27/2022   HCT 42.2 05/27/2022   MCV 97.7 05/27/2022   PLT 199 05/27/2022   No results found for: "IRON", "TIBC", "FERRITIN"  Attestation Statements:   Reviewed by clinician on day of visit: allergies, medications, problem list, medical history, surgical history, family history, social history, and previous encounter notes.   I, Burt Knack, am acting as transcriptionist for Reuben Likes MD.  I have reviewed the above documentation for accuracy and completeness, and I agree with the above. - Reuben Likes, MD

## 2023-07-04 ENCOUNTER — Ambulatory Visit (INDEPENDENT_AMBULATORY_CARE_PROVIDER_SITE_OTHER): Payer: BC Managed Care – PPO | Admitting: Family Medicine

## 2023-07-04 ENCOUNTER — Encounter (INDEPENDENT_AMBULATORY_CARE_PROVIDER_SITE_OTHER): Payer: Self-pay | Admitting: Family Medicine

## 2023-07-04 VITALS — BP 112/70 | HR 71 | Temp 97.9°F | Ht 64.0 in | Wt 272.0 lb

## 2023-07-04 DIAGNOSIS — E038 Other specified hypothyroidism: Secondary | ICD-10-CM

## 2023-07-04 DIAGNOSIS — E559 Vitamin D deficiency, unspecified: Secondary | ICD-10-CM | POA: Diagnosis not present

## 2023-07-04 DIAGNOSIS — Z6841 Body Mass Index (BMI) 40.0 and over, adult: Secondary | ICD-10-CM

## 2023-07-04 DIAGNOSIS — E669 Obesity, unspecified: Secondary | ICD-10-CM | POA: Diagnosis not present

## 2023-07-04 DIAGNOSIS — I10 Essential (primary) hypertension: Secondary | ICD-10-CM | POA: Diagnosis not present

## 2023-07-04 MED ORDER — VITAMIN D (ERGOCALCIFEROL) 1.25 MG (50000 UNIT) PO CAPS
50000.0000 [IU] | ORAL_CAPSULE | ORAL | 0 refills | Status: DC
Start: 2023-07-04 — End: 2023-09-11

## 2023-07-04 MED ORDER — LEVOTHYROXINE SODIUM 50 MCG PO TABS
50.0000 ug | ORAL_TABLET | Freq: Every day | ORAL | 0 refills | Status: DC
Start: 2023-07-04 — End: 2023-08-13

## 2023-07-04 MED ORDER — LISINOPRIL 5 MG PO TABS
5.0000 mg | ORAL_TABLET | Freq: Every day | ORAL | 0 refills | Status: DC
Start: 2023-07-04 — End: 2023-11-13

## 2023-07-04 NOTE — Progress Notes (Signed)
Chief Complaint:   OBESITY Shelby Clarke is here to discuss her progress with her obesity treatment plan along with follow-up of her obesity related diagnoses. Shelby Clarke is on keeping a food journal and adhering to recommended goals of 1600 calories and 110+ grams of protein and states she is following her eating plan approximately 75% of the time. Shelby Clarke states she is lifting weights and walking for 20-30 minutes 3-5 times per week.  Today's visit was #: 7 Starting weight: 316 lbs Starting date: 12/19/2022 Today's weight: 272 lbs Today's date: 07/04/2023 Total lbs lost to date: 44 Total lbs lost since last in-office visit: 6  Interim History: Since last appointment she has been trying to walk 3 days a week at least 20-30 minutes.  She has significant osteoarthritis and has hardware in her ankle that makes it difficult to do so much on her feet.  She is needing to recover the next day because of the above.  She has started resistance training since last appointment.  She has had a stressful two weeks.  She went over 25-50 calories a few days and realizes she is an emotional eater and she was able to control more consistently the amount of emotional eating she did.  Some of her strategies to decrease her emotional eating is getting out of her house to remove herself from where the temptations are.  She has been also doing more around the house.  Her daughter and son in law have also started changing their food intake and want to eat more inline with patient.   Subjective:   1. Essential hypertension Patient's blood pressure is controlled today.  She denies chest pain, chest pressure, or headache.  2. Vitamin D deficiency Patient is on prescription vitamin D.  She denies nausea, vomiting, or muscle weakness but notes fatigue.  3. Other specified hypothyroidism Patient is on Synthroid 50 mcg daily.  She denies cold or hot intolerance.  Her last TSH was within normal limits.  Assessment/Plan:    1. Essential hypertension We will refill Zestril 5 mg once daily for 90 days.  - lisinopril (ZESTRIL) 5 MG tablet; Take 1 tablet (5 mg total) by mouth daily.  Dispense: 90 tablet; Refill: 0  2. Vitamin D deficiency We will refill vitamin D 50,000 IU once weekly for 90 days.  - Vitamin D, Ergocalciferol, (DRISDOL) 1.25 MG (50000 UNIT) CAPS capsule; Take 1 capsule (50,000 Units total) by mouth every 7 (seven) days.  Dispense: 12 capsule; Refill: 0  3. Other specified hypothyroidism We will refill levothyroxine 50 mcg once daily for 90 days.  - levothyroxine (SYNTHROID) 50 MCG tablet; Take 1 tablet (50 mcg total) by mouth daily before breakfast.  Dispense: 90 tablet; Refill: 0  4. BMI 45.0-49.9, adult (HCC)  5. Obesity with starting BMI of 54.7 Shelby Clarke is currently in the action stage of change. As such, her goal is to continue with weight loss efforts. She has agreed to keeping a food journal and adhering to recommended goals of 1600 calories and 110+ grams of protein daily.   Exercise goals: As is.  Patient is to start chair exercises instead of walking.  Behavioral modification strategies: increasing lean protein intake, meal planning and cooking strategies, keeping healthy foods in the home, and planning for success.  Shelby Clarke has agreed to follow-up with our clinic in 4 weeks. She was informed of the importance of frequent follow-up visits to maximize her success with intensive lifestyle modifications for her multiple health conditions.  Objective:   Blood pressure 112/70, pulse 71, temperature 97.9 F (36.6 C), height 5\' 4"  (1.626 m), weight 272 lb (123.4 kg), SpO2 99%. Body mass index is 46.69 kg/m.  General: Cooperative, alert, well developed, in no acute distress. HEENT: Conjunctivae and lids unremarkable. Cardiovascular: Regular rhythm.  Lungs: Normal work of breathing. Neurologic: No focal deficits.   Lab Results  Component Value Date   CREATININE 0.53 (L)  05/02/2023   BUN 21 05/02/2023   NA 140 05/02/2023   K 5.0 05/02/2023   CL 104 05/02/2023   CO2 24 05/02/2023   Lab Results  Component Value Date   ALT 30 05/02/2023   AST 24 05/02/2023   ALKPHOS 67 05/02/2023   BILITOT 0.3 05/02/2023   Lab Results  Component Value Date   HGBA1C 5.6 05/02/2023   HGBA1C 6.1 (H) 01/01/2023   HGBA1C 6.0 (H) 06/28/2022   Lab Results  Component Value Date   INSULIN 12.8 05/02/2023   INSULIN 22.8 01/01/2023   Lab Results  Component Value Date   TSH 2.510 01/01/2023   Lab Results  Component Value Date   CHOL 151 05/02/2023   HDL 42 05/02/2023   LDLCALC 92 05/02/2023   TRIG 91 05/02/2023   CHOLHDL 3.6 05/02/2023   Lab Results  Component Value Date   VD25OH 36.8 05/02/2023   VD25OH 15.9 (L) 01/01/2023   Lab Results  Component Value Date   WBC 4.8 05/27/2022   HGB 12.0 05/27/2022   HCT 42.2 05/27/2022   MCV 97.7 05/27/2022   PLT 199 05/27/2022   No results found for: "IRON", "TIBC", "FERRITIN"  Attestation Statements:   Reviewed by clinician on day of visit: allergies, medications, problem list, medical history, surgical history, family history, social history, and previous encounter notes.   I, Burt Knack, am acting as transcriptionist for Reuben Likes, MD.  I have reviewed the above documentation for accuracy and completeness, and I agree with the above. - Reuben Likes, MD

## 2023-08-01 ENCOUNTER — Ambulatory Visit (INDEPENDENT_AMBULATORY_CARE_PROVIDER_SITE_OTHER): Payer: BC Managed Care – PPO | Admitting: Family Medicine

## 2023-08-13 ENCOUNTER — Ambulatory Visit (INDEPENDENT_AMBULATORY_CARE_PROVIDER_SITE_OTHER): Payer: BC Managed Care – PPO | Admitting: Family Medicine

## 2023-08-13 ENCOUNTER — Encounter (INDEPENDENT_AMBULATORY_CARE_PROVIDER_SITE_OTHER): Payer: Self-pay | Admitting: Family Medicine

## 2023-08-13 ENCOUNTER — Other Ambulatory Visit: Payer: Self-pay

## 2023-08-13 VITALS — BP 107/70 | HR 62 | Temp 97.6°F | Ht 64.0 in | Wt 269.0 lb

## 2023-08-13 DIAGNOSIS — E038 Other specified hypothyroidism: Secondary | ICD-10-CM

## 2023-08-13 DIAGNOSIS — Z6841 Body Mass Index (BMI) 40.0 and over, adult: Secondary | ICD-10-CM | POA: Diagnosis not present

## 2023-08-13 DIAGNOSIS — R7303 Prediabetes: Secondary | ICD-10-CM | POA: Diagnosis not present

## 2023-08-13 MED ORDER — LEVOTHYROXINE SODIUM 50 MCG PO TABS
50.0000 ug | ORAL_TABLET | Freq: Every day | ORAL | 0 refills | Status: DC
  Filled 2023-08-13: qty 90, 90d supply, fill #0

## 2023-08-13 NOTE — Progress Notes (Signed)
[facility] MEDICAL WEIGHT [facility] [address] [place] [address] Dept: [phone] Dept Fax: [phone]  SUBJECTIVE:  Chief Complaint: Obesity  Interim History: Patient has been sick since last appointment.  First she had Covid then turned into a sinus infection.  She is currently on antibiotic and is finally feeling a little bit better.  She is on an antibiotic now- Augmentin and has experienced a bit of diarrhea. Unfortunately has not been able to fill her levoythroxine due to back order.  She has been 95% in terms of keeping track of food she has been taking in. She is planning to go to her daughter's house for Thanksgiving.  She has gone over calories a few times in the last few weeks due to illness.    Shelby Clarke is here to discuss her progress with her obesity treatment plan. She is on the keeping a food journal and adhering to recommended goals of 1600 calories and 110+ grams of protein and states she is following her eating plan approximately 95 % of the time. She states she is not exercising.   OBJECTIVE: Visit Diagnoses: Problem List Items Addressed This Visit       Endocrine   Other specified hypothyroidism - Primary    Patient could not get Synthroid due to backorder.  She needs to get her prescription sent to a different pharmacy. She only has 2 tablets left.  She needs a new prescription sent in to a different pharmacy.       Relevant Medications   levothyroxine (SYNTHROID) 50 MCG tablet     Other   Morbid obesity (HCC) with starting BMI 54 (Chronic)   Pre-diabetes    Last A1c was at goal at 5.6.  She has had a few days of increased drive to snack and eat and higher cravings for indulgences than she did previously.  She does realize that those were the days she was ill with Covid and then sinus infection.  Aside from recent illness this was the first time she experienced these symptoms.       Other Visit  Diagnoses     BMI 50.0-59.9, adult (HCC)           Vitals Temp: 97.6 F (36.4 C) BP: 107/70 Pulse Rate: 62 SpO2: 96 %   Anthropometric Measurements Height: 5\' 4"  (1.626 m) Weight: 269 lb (122 kg) BMI (Calculated): 46.15 Weight at Last Visit: 272 lb Weight Lost Since Last Visit: 3 Weight Gained Since Last Visit: 0 Starting Weight: 316 lb Total Weight Loss (lbs): 47 lb (21.3 kg)   Body Composition  Body Fat %: 57.9 % Fat Mass (lbs): 155.8 lbs Muscle Mass (lbs): 107.6 lbs Visceral Fat Rating : 20   Other Clinical Data Today's Visit #: 7 Starting Date: 12/19/22     ASSESSMENT AND PLAN:  Diet: Shelby Clarke is currently in the action stage of change. As such, her goal is to continue with weight loss efforts. She has agreed to keeping a food journal and adhering to recommended goals of 1600 calories and 110 protein.  Exercise: Shelby Clarke has been instructed that some exercise is better than none for weight loss and overall health benefits.   Behavior Modification:  We discussed the following Behavioral Modification Strategies today: increasing lean protein intake, meal planning and cooking strategies, and holiday eating strategies.   No follow-ups on file.Marland Kitchen She was informed of the importance of frequent follow up visits to maximize her success  with intensive lifestyle modifications for her multiple health conditions.  Attestation Statements:   Reviewed by clinician on day of visit: allergies, medications, problem list, medical history, surgical history, family history, social history, and previous encounter notes.   Reuben Likes, MD

## 2023-08-13 NOTE — Assessment & Plan Note (Signed)
Patient could not get Synthroid due to backorder.  She needs to get her prescription sent to a different pharmacy. She only has 2 tablets left.  She needs a new prescription sent in to a different pharmacy.

## 2023-08-13 NOTE — Assessment & Plan Note (Signed)
Last A1c was at goal at 5.6.  She has had a few days of increased drive to snack and eat and higher cravings for indulgences than she did previously.  She does realize that those were the days she was ill with Covid and then sinus infection.  Aside from recent illness this was the first time she experienced these symptoms.

## 2023-09-11 ENCOUNTER — Ambulatory Visit (INDEPENDENT_AMBULATORY_CARE_PROVIDER_SITE_OTHER): Payer: BC Managed Care – PPO | Admitting: Family Medicine

## 2023-09-11 ENCOUNTER — Encounter (INDEPENDENT_AMBULATORY_CARE_PROVIDER_SITE_OTHER): Payer: Self-pay | Admitting: Family Medicine

## 2023-09-11 DIAGNOSIS — Z6841 Body Mass Index (BMI) 40.0 and over, adult: Secondary | ICD-10-CM

## 2023-09-11 DIAGNOSIS — E669 Obesity, unspecified: Secondary | ICD-10-CM | POA: Diagnosis not present

## 2023-09-11 DIAGNOSIS — R7303 Prediabetes: Secondary | ICD-10-CM | POA: Diagnosis not present

## 2023-09-11 DIAGNOSIS — E559 Vitamin D deficiency, unspecified: Secondary | ICD-10-CM

## 2023-09-11 MED ORDER — VITAMIN D (ERGOCALCIFEROL) 1.25 MG (50000 UNIT) PO CAPS: 50000.0000 [IU] | ORAL_CAPSULE | ORAL | 0 refills | Status: DC

## 2023-09-11 MED ORDER — METFORMIN HCL ER 750 MG PO TB24
750.0000 mg | ORAL_TABLET | Freq: Every day | ORAL | 0 refills | Status: DC
Start: 2023-09-11 — End: 2024-01-27

## 2023-09-11 NOTE — Assessment & Plan Note (Signed)
 Discussed importance of vitamin d supplementation.  Vitamin d supplementation has been shown to decrease fatigue, decrease risk of progression to insulin resistance and then prediabetes, decreases risk of falling in older age and can even assist in decreasing depressive symptoms in PTSD.   Prescription for Vitamin D sent in.

## 2023-09-11 NOTE — Assessment & Plan Note (Addendum)
On metformin with good carb control.  A1c in July of 5.6 (improved from 6.1).  She denies any GI SE of metformin even with slight increased carb intake on Thanksgiving.  She has been consistent with her food journaling and logging and hitting calorie and protein goals.  Refill of Metformin sent in.

## 2023-09-11 NOTE — Progress Notes (Signed)
SUBJECTIVE:  Chief Complaint: Obesity  Interim History: She went to her sister in laws house for Thanksgiving and got together with all her family.  Leading up to Thanksgiving she has been wrapping presents mostly and trying to incorporate weight lifting.  She is also trying to do some chair exercises so she doesn't hurt her knees.  For the month of December she has about the same going on. Planning on staying local.  Shelby Clarke is here to discuss her progress with her obesity treatment plan. She is on the keeping a food journal and adhering to recommended goals of 1600 calories and 110+ grams of protein and states she is following her eating plan approximately 98 % of the time. She states she is weight lifting and chair exercises 30 minutes 3-4 times per week.   OBJECTIVE: Visit Diagnoses: Problem List Items Addressed This Visit       Other   Pre-diabetes    On metformin with good carb control.  A1c in July of 5.6 (improved from 6.1).  She denies any GI SE of metformin even with slight increased carb intake on Thanksgiving.  She has been consistent with her food journaling and logging and hitting calorie and protein goals.  Refill of Metformin sent in.      Relevant Medications   metFORMIN (GLUCOPHAGE-XR) 750 MG 24 hr tablet   Vitamin D deficiency    Discussed importance of vitamin d supplementation.  Vitamin d supplementation has been shown to decrease fatigue, decrease risk of progression to insulin resistance and then prediabetes, decreases risk of falling in older age and can even assist in decreasing depressive symptoms in PTSD.   Prescription for Vitamin D sent in.        Relevant Medications   Vitamin D, Ergocalciferol, (DRISDOL) 1.25 MG (50000 UNIT) CAPS capsule    Vitals Temp: 97.6 F (36.4 C) BP: 101/68 Pulse Rate: 72 SpO2: 97 %   Anthropometric Measurements Height: 5\' 4"  (1.626 m) Weight: 265 lb (120.2 kg) BMI (Calculated): 45.46 Weight at Last Visit: 272  lb Weight Lost Since Last Visit: 4 Weight Gained Since Last Visit: 0 Starting Weight: 316 lb Total Weight Loss (lbs): 51 lb (23.1 kg)   Body Composition  Body Fat %: 56.7 % Fat Mass (lbs): 150.6 lbs Muscle Mass (lbs): 109.2 lbs Visceral Fat Rating : 20   Other Clinical Data Today's Visit #: 8 Starting Date: 12/19/22     ASSESSMENT AND PLAN:  Diet: Shelby Clarke is currently in the action stage of change. As such, her goal is to continue with weight loss efforts. She has agreed to keeping a food journal and adhering to recommended goals of 1600 calories and 110 grams of protein daily.  Exercise: Shelby Clarke has been instructed to continue exercising as is for weight loss and overall health benefits.  Wants to continue with her chair exercises and weight lifting and she wants to starts weighted hula hoop.   Behavior Modification:  We discussed the following Behavioral Modification Strategies today: increasing lean protein intake, increasing vegetables, meal planning and cooking strategies, better snacking choices, holiday eating strategies, and planning for success.  No follow-ups on file.Marland Kitchen She was informed of the importance of frequent follow up visits to maximize her success with intensive lifestyle modifications for her multiple health conditions.  Attestation Statements:   Reviewed by clinician on day of visit: allergies, medications, problem list, medical history, surgical history, family history, social history, and previous encounter notes.      [place]  Lawson Radar, MD

## 2023-09-16 ENCOUNTER — Telehealth: Payer: Self-pay | Admitting: Neurology

## 2023-09-16 NOTE — Telephone Encounter (Signed)
Sent mychart message to the patient to schedule SS.

## 2023-09-17 NOTE — Telephone Encounter (Signed)
Split- [employer] no auth req via form   Patient is scheduled at [facility] for 10/28/23 at 9 pm.  Mailed packet to the patient as well.

## 2023-09-30 ENCOUNTER — Other Ambulatory Visit: Payer: Self-pay | Admitting: Physician Assistant

## 2023-09-30 DIAGNOSIS — Z1231 Encounter for screening mammogram for malignant neoplasm of breast: Secondary | ICD-10-CM

## 2023-10-22 ENCOUNTER — Ambulatory Visit (INDEPENDENT_AMBULATORY_CARE_PROVIDER_SITE_OTHER): Payer: BC Managed Care – PPO | Admitting: Family Medicine

## 2023-10-28 NOTE — Telephone Encounter (Signed)
I reached out to the patient to r/s her SS that is scheduled for tonight due to sleep tech is sick. She did not pick up but I left a voicemail that I needed to r/s her SS.   I also left my direct number and sent a mychart message.

## 2023-11-06 ENCOUNTER — Ambulatory Visit (INDEPENDENT_AMBULATORY_CARE_PROVIDER_SITE_OTHER): Payer: BC Managed Care – PPO | Admitting: Family Medicine

## 2023-11-11 ENCOUNTER — Ambulatory Visit (INDEPENDENT_AMBULATORY_CARE_PROVIDER_SITE_OTHER): Payer: BC Managed Care – PPO | Admitting: Neurology

## 2023-11-11 DIAGNOSIS — R0683 Snoring: Secondary | ICD-10-CM

## 2023-11-11 DIAGNOSIS — R29898 Other symptoms and signs involving the musculoskeletal system: Secondary | ICD-10-CM

## 2023-11-11 DIAGNOSIS — G4701 Insomnia due to medical condition: Secondary | ICD-10-CM

## 2023-11-11 DIAGNOSIS — R519 Headache, unspecified: Secondary | ICD-10-CM

## 2023-11-11 DIAGNOSIS — G4733 Obstructive sleep apnea (adult) (pediatric): Secondary | ICD-10-CM | POA: Diagnosis not present

## 2023-11-11 DIAGNOSIS — G8929 Other chronic pain: Secondary | ICD-10-CM

## 2023-11-11 DIAGNOSIS — E662 Morbid (severe) obesity with alveolar hypoventilation: Secondary | ICD-10-CM

## 2023-11-13 ENCOUNTER — Ambulatory Visit (INDEPENDENT_AMBULATORY_CARE_PROVIDER_SITE_OTHER): Payer: BC Managed Care – PPO | Admitting: Family Medicine

## 2023-11-13 ENCOUNTER — Encounter (INDEPENDENT_AMBULATORY_CARE_PROVIDER_SITE_OTHER): Payer: Self-pay | Admitting: Family Medicine

## 2023-11-13 VITALS — BP 107/67 | HR 61 | Temp 97.6°F | Ht 64.0 in | Wt 266.0 lb

## 2023-11-13 DIAGNOSIS — Z6841 Body Mass Index (BMI) 40.0 and over, adult: Secondary | ICD-10-CM | POA: Diagnosis not present

## 2023-11-13 DIAGNOSIS — E038 Other specified hypothyroidism: Secondary | ICD-10-CM | POA: Diagnosis not present

## 2023-11-13 DIAGNOSIS — I10 Essential (primary) hypertension: Secondary | ICD-10-CM | POA: Diagnosis not present

## 2023-11-13 MED ORDER — LISINOPRIL 5 MG PO TABS
5.0000 mg | ORAL_TABLET | Freq: Every day | ORAL | 0 refills | Status: DC
Start: 2023-11-13 — End: 2024-01-27

## 2023-11-13 NOTE — Progress Notes (Signed)
 SUBJECTIVE:  Chief Complaint: Obesity  Interim History: Patient and her family had circulating URI over the last month and voices that she felt terribly and it was much more prolonged than she anticipated. Finally started feeling better last Thursday.  She is still coughing every once in a while.  Patient had a great holiday season prior to getting sick.  January was particularly difficult because when she was able to drink protein shakes it made her cough significantly worse.  Then with being sick she struggled to eat healthy.  She is surprised she only gained a pound. She did not log during last month.  No upcoming vacation or anything pressing this month.   Shelby Clarke is here to discuss her progress with her obesity treatment plan. She is on the keeping a food journal and adhering to recommended goals of 1600 calories and 110 grams of protein and states she is following her eating plan approximately 50 % of the time. She states she is exercising with infinity hoop for 40 minutes 2 times this week.   OBJECTIVE: Visit Diagnoses: Problem List Items Addressed This Visit       Cardiovascular and Mediastinum   Essential hypertension - Primary   Blood pressure stays very well-controlled with her dose of lisinopril .  She needs a refill of her lisinopril  today.  Will likely go down to 2.5 mg of lisinopril  daily at next appointment.  If she is able to tolerate that we will go off.      Relevant Medications   lisinopril  (ZESTRIL ) 5 MG tablet     Endocrine   Other specified hypothyroidism   Last TSH was well-managed and controlled and within normal limits.  She will need a repeat TSH at next lab draw.  She does not need a refill of her levothyroxine  today.        Other   Morbid obesity (HCC) with starting BMI 54 (Chronic)   Starting weight of 316 pounds on December 19, 2022.  Current weight of 266 with a BMI of 45.6.  Patient has been able to be consistent on her meal plan since starting this  program.  She realizes she has had more indulgent food in her house over the holidays.  Her goal in the next few weeks is to grocery shop get back to meal planning and get the indulgent food out of her house.  No change to the category meal plan at this time.      Other Visit Diagnoses       BMI 45.0-49.9, adult (HCC)           No data recorded  No data recorded  No data recorded  No data recorded    ASSESSMENT AND PLAN:  Diet: Shelby Clarke is currently in the action stage of change. As such, her goal is to continue with weight loss efforts. She has agreed to keeping a food journal and adhering to recommended goals of 1600 calories and 110 grams of protein daily.  Patient is starting to get the indulgent snacks out of her house and getting her groceries that are more nutritious back into the house.  Exercise: Shelby Clarke has been instructed to work up to a goal of 150 minutes of combined cardio and strengthening exercise per week and that some exercise is better than none for weight loss and overall health benefits.  She is going to set a time for her to exercise and then be consistent with it.   Behavior Modification:  We  discussed the following Behavioral Modification Strategies today: increasing lean protein intake, meal planning and cooking strategies, and better snacking choices.   No follow-ups on file.Shelby Clarke She was informed of the importance of frequent follow up visits to maximize her success with intensive lifestyle modifications for her multiple health conditions.  Attestation Statements:   Reviewed by clinician on day of visit: allergies, medications, problem list, medical history, surgical history, family history, social history, and previous encounter notes.    Adelita Cho, MD

## 2023-11-19 NOTE — Assessment & Plan Note (Signed)
Blood pressure stays very well-controlled with her dose of lisinopril.  She needs a refill of her lisinopril today.  Will likely go down to 2.5 mg of lisinopril daily at next appointment.  If she is able to tolerate that we will go off.

## 2023-11-19 NOTE — Assessment & Plan Note (Signed)
Starting weight of 316 pounds on December 19, 2022.  Current weight of 266 with a BMI of 45.6.  Patient has been able to be consistent on her meal plan since starting this program.  She realizes she has had more indulgent food in her house over the holidays.  Her goal in the next few weeks is to grocery shop get back to meal planning and get the indulgent food out of her house.  No change to the category meal plan at this time.

## 2023-11-19 NOTE — Assessment & Plan Note (Signed)
Last TSH was well-managed and controlled and within normal limits.  She will need a repeat TSH at next lab draw.  She does not need a refill of her levothyroxine today.

## 2023-11-24 ENCOUNTER — Encounter: Payer: Self-pay | Admitting: Neurology

## 2023-11-24 NOTE — Addendum Note (Signed)
 Addended by: Melvyn Novas on: 11/24/2023 04:08 PM   Modules accepted: Orders

## 2023-11-24 NOTE — Procedures (Signed)
 [facility] POLYSOMNOGRAPHY  INTERPRETATION REPORT   STUDY DATE:  11/11/2023     PATIENT NAME:  Shelby Clarke         DATE OF BIRTH:  11/29/1965  PATIENT ID:  [id number]    TYPE OF STUDY:  SPLIT  READING PHYSICIAN: Melvyn Novas, MD REFERRED BY: Cathey Endow, DO  SCORING TECHNICIAN: Domingo Cocking, RPSGT   HISTORY:  This 58 year-old Female patient was seen on 01/23/2023 and referred by Seymour Bars, DO, for a Sleep Consultation. Chief concern: see above , Patient states it hard for her to fall asleep , being in chronic  pain, her mind is racing- she will wake up in the middle of the night with pain in her leg, left knee and right foot. 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 She has woken herself from snoring as well. Nocturia 2-3 times each night. Morbidly obese - Pre diabetic on metformin. Had nocturnal chest pain last year (2023), waking SOB, gasping, coughing. Patient states she stopped breathing while sleeping.  Acetaminophen, Voltaren, Pepcid, Synthroid, Zestril, Claritin, Mobic, Glucophage XR, Prilosec, Vitamin D, Aspirin, LasixADDITIONAL INFORMATION:  The Epworth Sleepiness Scale endorsed at 15 /24 points (scores above or equal to 10 are suggestive of hypersomnolence). FSS endorsed at 52 /63 points. GDS at 5/ 15 points.    Height: 65 in Weight: 313 lb (BMI 52) Neck Size: 16 in  MEDICATIONS: Acetaminophen, Voltaren, Pepcid, Synthroid, Zestril, Claritin, Mobic, Glucophage XR, Prilosec, Vitamin D, Aspirin, Lasix  SLEEP CONTINUITY AND SLEEP ARCHITECTURE:  Lights-out was at 21:49: and lights-on at  04:47:, with  7.0 minutes of recording time . Total sleep time ( TST) was 244.5 minutes with a decreased sleep efficiency at 58.5%. Sleep latency was normal at 16.5 minutes.  REM sleep latency was increased at 216.0 minutes. Of the total sleep time, the percentage of stage N1 sleep was 2.5%, stage N2 sleep was 74%, stage N3 sleep was 11.7%, and REM  sleep was 12.3%.  There were 2 Stage R periods observed on this study night, 17 awakenings (i.e. transitions to Stage W from any sleep stage), and 54 total stage transitions. Wake after sleep onset (WASO) time accounted for 157 minutes.  BODY POSITION:  TST was divided  between the following sleep positions as follows: supine 69 minutes (28%), non-supine 176 minutes (72%); right 137 minutes (56%), left 00 minutes (0%), and prone 38 minutes (16%).  Total supine REM sleep time was 00 minutes (0% of total REM sleep).  RESPIRATORY MONITORING:   Based on CMS criteria (using a 4% oxygen desaturation rule for scoring hypopneas), there were 6 apneas (6 obstructive; 0 central; 0 mixed), and 40 hypopneas.  Apnea index was 1.5. Hypopnea index was 9.8. The apnea-hypopnea index was 11.3 overall (13.9 supine, 32 non-supine; 32.0 REM, 0.0 supine REM).  There were 0 respiratory effort-related arousals (RERAs).    Based on AASM criteria (using a 3% oxygen desaturation and /or arousal rule for scoring hypopneas), there were 6 apneas (6 obstructive; 0 central; 0 mixed), and 46 hypopneas. Apnea index was 1.5. Hypopnea index was 11.3. The apnea-hypopnea index was 12.8 overall (14.8 supine, 32 non-supine; 32.0 REM, 0.0 supine REM).  There were 0 respiratory effort-related arousals (RERAs).     OXIMETRY: Oxyhemoglobin Saturation Nadir during sleep was at  74 % from a mean of 92%.  Hypoxemia as part of the Total sleep time (TST<89%) was present for 21.8 minutes, or 8.9% of total sleep time.  The 02 nadir 74%  was reached during REM sleep.  LIMB MOVEMENTS: There were 0 periodic limb movements of sleep (0.0/h). EKG: The electrocardiogram documented NSR with many sinus pauses .   The average heart rate during sleep was 66 bpm.  The heart rate during sleep varied between a minimum of 51 bpm and  a maximum of  90 bpm. AUDIO and VIDEO:  no complex vocalization or movements were captured.  EEG:  PSG EEG was of normal amplitude  and frequency, with symmetric manifestation of sleep stages. AROUSAL: There were 26 arousals in total, for an arousal index of 4 arousals/hour.  Of these, 3 were identified as respiratory-related arousals (1 /h), 0 were PLM-related arousals (0 /h), and 23 were non-specific arousals (6 /h).  IMPRESSION: Sleep disordered breathing was present.  This study revealed mild obstructive  sleep apnea , moderately loud snoring , and apnea and snoring were both exacerbated in supine sleep and REM sleep.   Hypoxia was strongly associated with REM sleep apnea/ hypopnea.   Hypoxemia as part of the Total sleep time (TST<89%) was present for 21.8 minutes, or 8.9% of total sleep time.  The 02 nadir 74% was reached during REM sleep.   Sleep efficiency was reduced to 58.5%. Total sleep time was reduced at 244.5 minutes.   Most arousals were spontaneous and attributed to pain by the patient. Periodic limb movements were not seen. Rec cognitive behavior therapy.     RECOMMENDATIONS:  REM sleep apnea and hypoxia will respond to PAP therapy , but not to therapy by dental device or implanted sublingual / hypoglossal  stimulator.  REM dependent hypoventilation is also closely correlated to obesity. Weight loss is of importance.  As the patient struggles with insomnia due to pain, it may be of most benefit to start with an autotitration CPAP device first, then return to her pain specialty clinic for any adjustments in dose.  My hope is that Mrs. Maskell will be able to sustain sleep better while on CPAP.  An autotitration device with a setting from 5-15 cm water, 2 cm EPR and humidity will be provided, DME will be asked to fit  our  patient for the most comfortable mask.    Melvyn Novas, MD Valinda Hoar           Name: Shelby Clarke BMI: 52.09 Physician: Melvyn Novas, MD  ID: [id number] Height: 65.0 in Technician: Domingo Cocking, RPSGT  Sex: Female Weight: 313.0 lb Record: [id number]  Age: 58  [Apr 07, 1966] Date: 11/11/2023    Medical & Medication History    Shelby Clarke is a 58 y.o. female patient who was seen on 01/23/2023 referred by Seymour Bars, DO, for a Sleep Consultation. Chief concern: see above , Patient states it hard for her to fall asleep , being in chronic  pain, her mind is racing- she will wake up in the middle of the night with pain in her leg, left knee and right foot. 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 She has woken herself from snoring as well. Nocturia 2-3 times each night. Morbidly obese - Prediabetic on metformin. Had nocturnal chest pain last year, waking SOB, gasping, coughing. Patient states she stopped breathing while sleeping.  Acetaminophen, Voltaren, Pepcid, Synthroid, Zestril, Claritin, Mobic, Glucophage XR, Prilosec, Vitamin D, Aspirin, Lasix   TECHNICAL DESCRIPTION: A registered sleep technologist ( RPSGT)  was in attendance for the duration of the recording.  Data collection, scoring, video monitoring, and reporting were  performed in compliance with the AASM Manual for the Scoring of Sleep and Associated Events; (Hypopnea is scored based on the criteria listed in Section VIII D. 1b in the AASM Manual V2.6 using a 4% oxygen desaturation rule or Hypopnea is scored based on the criteria listed in Section VIII D. 1a in the AASM Manual V2.6 using 3% oxygen desaturation and /or arousal rule).       Comments   Patient arrived for a diagnostic polysomnogram. Procedure explained and all questions answered. Standard paste setup without complications. Patient slept supine and right. Somewhat fragmented sleep with significant movement and mumbling. Mild to moderate snoring was noted. Respiratory events observed, worse while in REM sleep. Cardiac arrhythmias noted, sinus pauses. No significant PLMS observed. One restroom visit.    Lights out: 09:49:33 PM Lights on: 04:47:54 AM   Time Total Supine Side Prone Upright  Recording (TRT) 6h 58.45m 2h 54.56m  3h 23.70m 0h 40.14m 0h 0.14m  Sleep (TST) 4h 4.4m 1h 9.65m 2h 17.23m 0h 38.69m 0h 0.1m   Latency N1 N2 N3 REM Onset Per. Slp. Eff.  Actual 0h 16.33m 0h 17.33m 1h 45.72m 3h 36.59m 0h 16.63m 0h 32.15m 58.49%   Stg Dur Wake N1 N2 N3 REM  Total 157.0 6.0 180.0 28.5 30.0  Supine 89.0 3.5 65.5 0.0 0.0  Side 66.5 2.5 88.5 27.5 18.5  Prone 1.5 0.0 26.0 1.0 11.5  Upright 0.0 0.0 0.0 0.0 0.0   Stg % Wake N1 N2 N3 REM  Total 39.1 2.5 73.6 11.7 12.3  Supine 22.2 1.4 26.8 0.0 0.0  Side 16.6 1.0 36.2 11.2 7.6  Prone 0.4 0.0 10.6 0.4 4.7  Upright 0.0 0.0 0.0 0.0 0.0     Apnea Summary Sub Supine Side Prone Upright  Total 6 Total 6 6 0 0 0    REM 0 0 0 0 0    NREM 6 6 0 0 0  Obs 6 REM 0 0 0 0 0    NREM 6 6 0 0 0  Mix 0 REM 0 0 0 0 0    NREM 0 0 0 0 0  Cen 0 REM 0 0 0 0 0    NREM 0 0 0 0 0   Rera Summary Sub Supine Side Prone Upright  Total 0 Total 0 0 0 0 0    REM 0 0 0 0 0    NREM 0 0 0 0 0   Hypopnea Summary Sub Supine Side Prone Upright  Total 46 Total 46 11 35 0 0    REM 16 0 16 0 0    NREM 30 11 19  0 0   4% Hypopnea Summary Sub Supine Side Prone Upright  Total (4%) 40 Total 40 10 30 0 0    REM 16 0 16 0 0    NREM 24 10 14  0 0     AHI Total Obs Mix Cen  12.76 Apnea 1.47 1.47 0.00 0.00   Hypopnea 11.29 -- -- --  11.29 Hypopnea (4%) 9.82 -- -- --    Total Supine Side Prone Upright  Position AHI 12.76 14.78 15.33 0.00 0.00  REM AHI 32.00   NREM AHI 10.07   Position RDI 12.76 14.78 15.33 0.00 0.00  REM RDI 32.00   NREM RDI 10.07    4% Hypopnea Total Supine Side Prone Upright  Position AHI (4%) 11.29 13.91 13.14 0.00 0.00  REM AHI (4%) 32.00   NREM AHI (4%) 8.39   Position  RDI (4%) 11.29 13.91 13.14 0.00 0.00  REM RDI (4%) 32.00   NREM RDI (4%) 8.39    Desaturation Information Threshold: 2% <100% <90% <80% <70% <60% <50% <40%  Supine 126.0 27.0 1.0 0.0 0.0 0.0 0.0  Side 103.0 25.0 9.0 1.0 0.0 0.0 0.0  Prone 22.0 11.0 5.0 0.0 0.0 0.0 0.0  Upright 0.0 0.0 0.0 0.0 0.0 0.0 0.0   Total 251.0 63.0 15.0 1.0 0.0 0.0 0.0  Index 37.5 9.4 2.2 0.1 0.0 0.0 0.0   Threshold: 3% <100% <90% <80% <70% <60% <50% <40%  Supine 84.0 27.0 1.0 0.0 0.0 0.0 0.0  Side 64.0 25.0 9.0 1.0 0.0 0.0 0.0  Prone 15.0 11.0 5.0 0.0 0.0 0.0 0.0  Upright 0.0 0.0 0.0 0.0 0.0 0.0 0.0  Total 163.0 63.0 15.0 1.0 0.0 0.0 0.0  Index 24.4 9.4 2.2 0.1 0.0 0.0 0.0   Threshold: 4% <100% <90% <80% <70% <60% <50% <40%  Supine 61.0 27.0 1.0 0.0 0.0 0.0 0.0  Side 41.0 23.0 9.0 1.0 0.0 0.0 0.0  Prone 14.0 11.0 5.0 0.0 0.0 0.0 0.0  Upright 0.0 0.0 0.0 0.0 0.0 0.0 0.0  Total 116.0 61.0 15.0 1.0 0.0 0.0 0.0  Index 17.3 9.1 2.2 0.1 0.0 0.0 0.0   Threshold: 4% <100% <90% <80% <70% <60% <50% <40%  Supine 61 27 1 0 0 0 0  Side 41 23 9 1  0 0 0  Prone 14 11 5  0 0 0 0  Upright 0 0 0 0 0 0 0  Total 116 61 15 1 0 0 0   Awakening/Arousal Information # of Awakenings 17  Wake after sleep onset 157.4m  Wake after persistent sleep 147.69m   Arousal Assoc. Arousals Index  Apneas 2 0.5  Hypopneas 1 0.2  Leg Movements 0 0.0  Snore 0 0.0  PTT Arousals 0 0.0  Spontaneous 23 5.6  Total 26 6.4  Leg Movement Information PLMS LMs Index  Total LMs during PLMS 0 0.0  LMs w/ Microarousals 0 0.0   LM LMs Index  w/ Microarousal 0 0.0  w/ Awakening 0 0.0  w/ Resp Event 0 0.0  Spontaneous 0 0.0  Total 0 0.0     Desaturation threshold setting: 4% Minimum desaturation setting: 10 seconds  Recording time SaO2 nadir: 53% The longest event was a 78 sec obstructive Hypopnea with a minimum SaO2 of 74%. The lowest SaO2 was 69% associated with a 68 sec obstructive Hypopnea. EKG Rates EKG Avg Max Min  Awake 69 97 54  Asleep 66 90 51

## 2023-12-17 ENCOUNTER — Ambulatory Visit (INDEPENDENT_AMBULATORY_CARE_PROVIDER_SITE_OTHER): Payer: BC Managed Care – PPO | Admitting: Family Medicine

## 2023-12-23 ENCOUNTER — Encounter (INDEPENDENT_AMBULATORY_CARE_PROVIDER_SITE_OTHER): Payer: Self-pay

## 2024-01-27 ENCOUNTER — Ambulatory Visit (INDEPENDENT_AMBULATORY_CARE_PROVIDER_SITE_OTHER): Admitting: Family Medicine

## 2024-01-27 ENCOUNTER — Encounter: Payer: Self-pay | Admitting: Family Medicine

## 2024-01-27 VITALS — BP 124/73 | HR 61 | Temp 98.0°F | Ht 64.0 in | Wt 269.0 lb

## 2024-01-27 DIAGNOSIS — E66813 Obesity, class 3: Secondary | ICD-10-CM

## 2024-01-27 DIAGNOSIS — Z6841 Body Mass Index (BMI) 40.0 and over, adult: Secondary | ICD-10-CM

## 2024-01-27 DIAGNOSIS — E559 Vitamin D deficiency, unspecified: Secondary | ICD-10-CM

## 2024-01-27 DIAGNOSIS — E038 Other specified hypothyroidism: Secondary | ICD-10-CM | POA: Diagnosis not present

## 2024-01-27 DIAGNOSIS — I10 Essential (primary) hypertension: Secondary | ICD-10-CM | POA: Diagnosis not present

## 2024-01-27 DIAGNOSIS — R7303 Prediabetes: Secondary | ICD-10-CM

## 2024-01-27 MED ORDER — LISINOPRIL 5 MG PO TABS: 5.0000 mg | ORAL_TABLET | Freq: Every day | ORAL | 0 refills | Status: AC

## 2024-01-27 MED ORDER — LEVOTHYROXINE SODIUM 50 MCG PO TABS: 50.0000 ug | ORAL_TABLET | Freq: Every day | ORAL | 0 refills | Status: DC

## 2024-01-27 MED ORDER — FAMOTIDINE 40 MG PO TABS
40.0000 mg | ORAL_TABLET | Freq: Every day | ORAL | 0 refills | Status: DC
Start: 2024-01-27 — End: 2024-04-20

## 2024-01-27 MED ORDER — QSYMIA 3.75-23 MG PO CP24: ORAL_CAPSULE | ORAL | 0 refills | Status: DC

## 2024-01-27 MED ORDER — METFORMIN HCL ER 750 MG PO TB24: 750.0000 mg | ORAL_TABLET | Freq: Every day | ORAL | 0 refills | Status: DC

## 2024-01-27 MED ORDER — QSYMIA 7.5-46 MG PO CP24: ORAL_CAPSULE | ORAL | 0 refills | Status: DC

## 2024-01-27 NOTE — Progress Notes (Signed)
 Office: [phone]  /  Fax: [phone]  WEIGHT SUMMARY AND BIOMETRICS  Starting Date: 12/19/22  Starting Weight: 316 lb   Weight Lost Since Last Visit: 0   Vitals Temp: 98 F (36.7 C) BP: 124/73 Pulse Rate: 61 SpO2: 100 %   Body Composition  Body Fat %: 59 % Fat Mass (lbs): 158.8 lbs Muscle Mass (lbs): 104.6 lbs Visceral Fat Rating : 21     HPI  Chief Complaint: OBESITY  Snow is here to discuss her progress with her obesity treatment plan. She is on the following a lower carbohydrate, vegetable and lean protein rich diet plan and states she is following her eating plan approximately 50 % of the time. She states she is walking more.  Interval History:  Since last office visit she is up 3 lb She was last seen by Dr Marijo Shove in Feb This gives her a net weight loss of 47 lb in 13 mos of medically supervised weight management This is a 14.8% TBW loss She is upset about hitting a stall in weight loss x 3 mos with increased hunger at night and working out less since getting sick this Winter She is still tracking daily steps She had considered WLS but is apprehensive about RYGB surgery She has not been tracking calories as often  Pharmacotherapy: none  PHYSICAL EXAM:  Blood pressure 124/73, pulse 61, temperature 98 F (36.7 C), height 5\' 4"  (1.626 m), weight 269 lb (122 kg), SpO2 100%. Body mass index is 46.17 kg/m.  General: She is overweight, cooperative, alert, well developed, and in no acute distress. PSYCH: Has normal mood, affect and thought process.   Lungs: Normal breathing effort, no conversational dyspnea.   ASSESSMENT AND PLAN  TREATMENT PLAN FOR OBESITY:  Recommended Dietary Goals  Priscille is currently in the action stage of change. As such, her goal is to continue weight management plan. She has agreed to keeping a food journal and adhering to recommended goals of 1600 calories and 100 g of  protein and practicing portion control and  making smarter food choices, such as increasing vegetables and decreasing simple carbohydrates.  Behavioral Intervention  We discussed the following Behavioral Modification Strategies today: increasing lean protein intake to established goals, increasing fiber rich foods, increasing water intake , work on meal planning and preparation, work on tracking and journaling calories using tracking application, keeping healthy foods at home, identifying sources and decreasing liquid calories, decreasing eating out or consumption of processed foods, and making healthy choices when eating convenient foods, work on managing stress, creating time for self-care and relaxation, avoiding temptations and identifying enticing environmental cues, continue to work on implementation of reduced calorie nutritional plan, and continue to work on maintaining a reduced calorie state, getting the recommended amount of protein, incorporating whole foods, making healthy choices, staying well hydrated and practicing mindfulness when eating..  Additional resources provided today: NA  Recommended Physical Activity Goals  Jodeci has been advised to work up to 150 minutes of moderate intensity aerobic activity a week and strengthening exercises 2-3 times per week for cardiovascular health, weight loss maintenance and preservation of muscle mass.   She has agreed to Start aerobic activity with a goal of 150 minutes a week at moderate intensity.   Pharmacotherapy changes for the treatment of obesity: begin Qsymia   PDMP reviewed Informed consent signed Reviewed MOA and common adverse SE  ASSOCIATED CONDITIONS ADDRESSED TODAY  Other specified hypothyroidism Lab Results  Component Value Date   TSH 2.510  01/01/2023   Repeat TSH next visit -     Levothyroxine  Sodium; Take 1 tablet (50 mcg total) by mouth daily before breakfast.  Dispense: 90 tablet; Refill: 0  Essential hypertension BP well controlled Denies adverse SE from  medication Update BMP next visit -     Lisinopril ; Take 1 tablet (5 mg total) by mouth daily.  Dispense: 90 tablet; Refill: 0  Pre-diabetes Lab Results  Component Value Date   HGBA1C 5.6 05/02/2023   Tolerating metformin  XR 750 mg well without adverse SE A1c has improved with healthy lifestyle changes and weight loss Repeat A1c next visit  -     metFORMIN  HCl ER; Take 1 tablet (750 mg total) by mouth daily with breakfast.  Dispense: 90 tablet; Refill: 0  Class 3 severe obesity due to excess calories with serious comorbidity and body mass index (BMI) of 45.0 to 49.9 in adult (HCC) -     Qsymia ; 1 capsule po qAM  Dispense: 14 capsule; Refill: 0 -     Qsymia ; 1 capsule po qAM  Dispense: 30 capsule; Refill: 0  Vitamin D  deficiency Last vitamin D  Lab Results  Component Value Date   VD25OH 36.8 05/02/2023   Taking RX vitamin D  weekly without adverse SE Repeat lab next visit Other orders -     Famotidine ; Take 1 tablet (40 mg total) by mouth at bedtime.  Dispense: 90 tablet; Refill: 0      She was informed of the importance of frequent follow up visits to maximize her success with intensive lifestyle modifications for her multiple health conditions.   ATTESTASTION STATEMENTS:  Reviewed by clinician on day of visit: allergies, medications, problem list, medical history, surgical history, family history, social history, and previous encounter notes pertinent to obesity diagnosis.    [name], D.O. DABFM, DABOM [facility] [address] [place], [place] [address] [phone]

## 2024-01-28 ENCOUNTER — Telehealth: Payer: Self-pay

## 2024-01-28 NOTE — Telephone Encounter (Signed)
 Per Cover My Meds: Qsymia  approved 01/28/24-04/28/24.

## 2024-01-28 NOTE — Telephone Encounter (Signed)
 PA submitted through Cover My Meds for Qsymia  3.75-23 mg. Awaiting insurance determination. Key: [id number]

## 2024-01-30 ENCOUNTER — Other Ambulatory Visit: Payer: Self-pay | Admitting: Family Medicine

## 2024-01-30 DIAGNOSIS — E038 Other specified hypothyroidism: Secondary | ICD-10-CM

## 2024-02-19 ENCOUNTER — Ambulatory Visit: Admitting: Family Medicine

## 2024-02-26 ENCOUNTER — Encounter: Payer: Self-pay | Admitting: Family Medicine

## 2024-02-26 ENCOUNTER — Ambulatory Visit (INDEPENDENT_AMBULATORY_CARE_PROVIDER_SITE_OTHER): Admitting: Family Medicine

## 2024-02-26 VITALS — BP 125/78 | HR 55 | Temp 97.8°F | Ht 64.0 in | Wt 257.0 lb

## 2024-02-26 DIAGNOSIS — E66813 Obesity, class 3: Secondary | ICD-10-CM

## 2024-02-26 DIAGNOSIS — R7303 Prediabetes: Secondary | ICD-10-CM

## 2024-02-26 DIAGNOSIS — E559 Vitamin D deficiency, unspecified: Secondary | ICD-10-CM

## 2024-02-26 DIAGNOSIS — Z6841 Body Mass Index (BMI) 40.0 and over, adult: Secondary | ICD-10-CM

## 2024-02-26 DIAGNOSIS — E038 Other specified hypothyroidism: Secondary | ICD-10-CM | POA: Diagnosis not present

## 2024-02-26 MED ORDER — VITAMIN D (ERGOCALCIFEROL) 1.25 MG (50000 UNIT) PO CAPS: 50000.0000 [IU] | ORAL_CAPSULE | ORAL | 0 refills | Status: DC

## 2024-02-26 MED ORDER — PHENTERMINE-TOPIRAMATE ER 7.5-46 MG PO CP24: ORAL_CAPSULE | ORAL | 0 refills | Status: DC

## 2024-02-26 MED ORDER — LEVOTHYROXINE SODIUM 50 MCG PO TABS: 50.0000 ug | ORAL_TABLET | Freq: Every day | ORAL | 0 refills | Status: DC

## 2024-02-26 NOTE — Progress Notes (Signed)
 Office: [phone]  /  Fax: [phone]  WEIGHT SUMMARY AND BIOMETRICS  Starting Date: 12/19/22  Starting Weight: 316lb   Weight Lost Since Last Visit: 12lb   Vitals Temp: 97.8 F (36.6 C) BP: 125/78 Pulse Rate: (!) 55 SpO2: 99 %   Body Composition  Body Fat %: 55.6 % Fat Mass (lbs): 143.2 lbs Muscle Mass (lbs): 108.6 lbs Total Body Water (lbs): 87.8 lbs Visceral Fat Rating : 18   HPI  Chief Complaint: OBESITY  Shelby Clarke is here to discuss her progress with her obesity treatment plan. She is on the keeping a food journal and adhering to recommended goals of 1600 calories and 100 protein and states she is following her eating plan approximately 75 % of the time. She states she is exercising 30 minutes 2-3 times per week.  Interval History:  Since last office visit she is down 12 lb She is up 4 pounds of muscle and is down 15.6 pounds of body fat since last visit This gives her net weight loss of 59 pounds in the past 14 months of medically supervised weight management This is an 18.6% total body weight loss She did start on Qsymia  last month and is feeling improved satiety since she moved up to the 7.5/46 mg  Food noise has improved She has some mild irritability and constipation as SE She has increased her water intake and this has helped  Pharmacotherapy: Qsymia  7.5/46 mg once daily with breakfast  PHYSICAL EXAM:  Blood pressure 125/78, pulse (!) 55, temperature 97.8 F (36.6 C), height 5\' 4"  (1.626 m), weight 257 lb (116.6 kg), SpO2 99%. Body mass index is 44.11 kg/m.  General: She is overweight, cooperative, alert, well developed, and in no acute distress. PSYCH: Has normal mood, affect and thought process.   Lungs: Normal breathing effort, no conversational dyspnea.   ASSESSMENT AND PLAN  TREATMENT PLAN FOR OBESITY:  Recommended Dietary Goals  Shelby Clarke is currently in the action stage of change. As such, her goal is to continue weight management  plan. She has agreed to the Category 3 Plan and keeping a food journal and adhering to recommended goals of 1600 calories and 100 to 120 g of protein.  Behavioral Intervention  We discussed the following Behavioral Modification Strategies today: increasing lean protein intake to established goals, increasing fiber rich foods, increasing water intake , work on meal planning and preparation, work on tracking and journaling calories using tracking application, keeping healthy foods at home, decreasing eating out or consumption of processed foods, and making healthy choices when eating convenient foods, practice mindfulness eating and understand the difference between hunger signals and cravings, work on managing stress, creating time for self-care and relaxation, avoiding temptations and identifying enticing environmental cues, and continue to work on maintaining a reduced calorie state, getting the recommended amount of protein, incorporating whole foods, making healthy choices, staying well hydrated and practicing mindfulness when eating..  Additional resources provided today: NA  Recommended Physical Activity Goals  Shelby Clarke has been advised to work up to 150 minutes of moderate intensity aerobic activity a week and strengthening exercises 2-3 times per week for cardiovascular health, weight loss maintenance and preservation of muscle mass.   She has agreed to Start aerobic activity with a goal of 150 minutes a week at moderate intensity.  Reviewed exercise change goals together and after visit summary Work on increasing walking time and tracking of daily steps  Pharmacotherapy changes for the treatment of obesity: None  ASSOCIATED CONDITIONS  ADDRESSED TODAY  Pre-diabetes Lab Results  Component Value Date   HGBA1C 5.6 05/02/2023  Prediabetes has improved with lifestyle change and weight reduction.  Update labs today.  Continue to work on prescribed dietary change, adding in more regular  walking. Metformin  discontinued -     Hemoglobin A1c -     Insulin , random -     Comprehensive metabolic panel with GFR  Vitamin D  deficiency Last vitamin D  Lab Results  Component Value Date   VD25OH 36.8 05/02/2023  She has been taking vitamin D  50,000 IU once weekly.  Her energy level has improved.  Recheck level today  -     Vitamin D  (Ergocalciferol ); Take 1 capsule (50,000 Units total) by mouth every 7 (seven) days.  Dispense: 12 capsule; Refill: 0 -     VITAMIN D  25 Hydroxy (Vit-D Deficiency, Fractures)  Obesity, Class III, BMI 40-49.9 (morbid obesity) Benefit outside risk to continuing Qsymia  at 7.5/46 mg once daily with breakfast.  She is not at risk for pregnancy and her blood pressure and heart rate are at goal.  Continue to work on prioritizing intake of lean protein and fiber with meals, adding in regular exercise.  Increase water and fiber intake to prevent the constipating side effects. -     Phentermine-Topiramate; 1 capsule po qAM  Dispense: 30 capsule; Refill: 0  Other specified hypothyroidism Is doing well on levothyroxine  50 mcg once daily.  Recheck TSH today -     Levothyroxine  Sodium; Take 1 tablet (50 mcg total) by mouth daily before breakfast.  Dispense: 90 tablet; Refill: 0 -     TSH Rfx on Abnormal to Free T4  BMI 40.0-44.9, adult [facility])      She was informed of the importance of frequent follow up visits to maximize her success with intensive lifestyle modifications for her multiple health conditions.   ATTESTASTION STATEMENTS:  Reviewed by clinician on day of visit: allergies, medications, problem list, medical history, surgical history, family history, social history, and previous encounter notes pertinent to obesity diagnosis.   I have personally spent 30 minutes total time today in preparation, patient care, nutritional counseling and education,  and documentation for this visit, including the following: review of most recent clinical lab tests,  prescribing medications/ refilling medications, reviewing medical assistant documentation, review and interpretation of bioimpedence results.     [name], D.O. DABFM, DABOM [facility] [address] [place], [place] [address] [phone]

## 2024-02-26 NOTE — Patient Instructions (Addendum)
 Get in 100 oz of water daily Add in one sugar free electrolyte drink daily like [employer], GZERO  Avoid decongestants and cough medicine while on Qsymia   Keep up the good work!  Aim for a good 30 min of walking 4 days/ wk  Labs today

## 2024-02-27 LAB — INSULIN, RANDOM: INSULIN: 16.4 u[IU]/mL (ref 2.6–24.9)

## 2024-02-27 LAB — COMPREHENSIVE METABOLIC PANEL WITH GFR
ALT: 19 IU/L (ref 0–32)
AST: 16 IU/L (ref 0–40)
Albumin: 4 g/dL (ref 3.8–4.9)
Alkaline Phosphatase: 76 IU/L (ref 44–121)
BUN/Creatinine Ratio: 39 — ABNORMAL HIGH (ref 9–23)
BUN: 20 mg/dL (ref 6–24)
Bilirubin Total: 0.3 mg/dL (ref 0.0–1.2)
CO2: 21 mmol/L (ref 20–29)
Calcium: 9.5 mg/dL (ref 8.7–10.2)
Chloride: 104 mmol/L (ref 96–106)
Creatinine, Ser: 0.51 mg/dL — ABNORMAL LOW (ref 0.57–1.00)
Globulin, Total: 2.9 g/dL (ref 1.5–4.5)
Glucose: 95 mg/dL (ref 70–99)
Potassium: 4.9 mmol/L (ref 3.5–5.2)
Sodium: 138 mmol/L (ref 134–144)
Total Protein: 6.9 g/dL (ref 6.0–8.5)
eGFR: 109 mL/min/{1.73_m2} (ref >59)

## 2024-02-27 LAB — HEMOGLOBIN A1C
Est. average glucose Bld gHb Est-mCnc: 114 mg/dL
Hgb A1c MFr Bld: 5.6 % (ref 4.8–5.6)

## 2024-02-27 LAB — TSH RFX ON ABNORMAL TO FREE T4: TSH: 1.73 u[IU]/mL (ref 0.450–4.500)

## 2024-02-27 LAB — VITAMIN D 25 HYDROXY (VIT D DEFICIENCY, FRACTURES): Vit D, 25-Hydroxy: 47.2 ng/mL (ref 30.0–100.0)

## 2024-03-09 ENCOUNTER — Ambulatory Visit: Payer: Self-pay | Admitting: Family Medicine

## 2024-03-31 ENCOUNTER — Ambulatory Visit: Admitting: Family Medicine

## 2024-03-31 ENCOUNTER — Telehealth: Payer: Self-pay | Admitting: *Deleted

## 2024-03-31 NOTE — Telephone Encounter (Signed)
 Prior authorization done via cover my meds for patients Qsymia. Waiting on determination.

## 2024-04-02 NOTE — Telephone Encounter (Signed)
 Qsymia  was approved. Patient notified.

## 2024-04-07 ENCOUNTER — Ambulatory Visit: Admitting: Family Medicine

## 2024-04-15 ENCOUNTER — Ambulatory Visit: Admitting: Family Medicine

## 2024-04-20 ENCOUNTER — Encounter: Payer: Self-pay | Admitting: Family Medicine

## 2024-04-20 ENCOUNTER — Ambulatory Visit (INDEPENDENT_AMBULATORY_CARE_PROVIDER_SITE_OTHER): Admitting: Family Medicine

## 2024-04-20 VITALS — BP 135/82 | HR 66 | Temp 98.4°F | Ht 64.0 in | Wt 262.0 lb

## 2024-04-20 DIAGNOSIS — R7303 Prediabetes: Secondary | ICD-10-CM | POA: Diagnosis not present

## 2024-04-20 DIAGNOSIS — Z6841 Body Mass Index (BMI) 40.0 and over, adult: Secondary | ICD-10-CM

## 2024-04-20 DIAGNOSIS — E038 Other specified hypothyroidism: Secondary | ICD-10-CM | POA: Diagnosis not present

## 2024-04-20 DIAGNOSIS — E559 Vitamin D deficiency, unspecified: Secondary | ICD-10-CM | POA: Diagnosis not present

## 2024-04-20 DIAGNOSIS — I10 Essential (primary) hypertension: Secondary | ICD-10-CM | POA: Diagnosis not present

## 2024-04-20 DIAGNOSIS — E66813 Obesity, class 3: Secondary | ICD-10-CM

## 2024-04-20 DIAGNOSIS — K219 Gastro-esophageal reflux disease without esophagitis: Secondary | ICD-10-CM

## 2024-04-20 MED ORDER — OMEPRAZOLE 40 MG PO CPDR: 40.0000 mg | DELAYED_RELEASE_CAPSULE | Freq: Every day | ORAL | 3 refills | Status: DC

## 2024-04-20 NOTE — Progress Notes (Signed)
 Office: [phone]  /  Fax: [phone]  WEIGHT SUMMARY AND BIOMETRICS  Starting Date: 12/19/22  Starting Weight: 316lb   Weight Lost Since Last Visit: 0lb   Vitals Temp: 98.4 F (36.9 C) BP: 135/82 Pulse Rate: 66 SpO2: 96 %   Body Composition  Body Fat %: 56.1 % Fat Mass (lbs): 147.2 lbs Muscle Mass (lbs): 106.4 lbs Visceral Fat Rating : 19     HPI  Chief Complaint: OBESITY  Shelby Clarke is here to discuss her progress with her obesity treatment plan. She is on the keeping a food journal and adhering to recommended goals of 1600 calories and 100-120 protein and states she is following her eating plan approximately 50 % of the time. She states she is doing chair exercises 20 minutes 3 times per week.  Interval History:  Since last office visit she is up 5 lb She was sick for for 3-4 weeks since last visit with a respiratory infection while on vacation She did pause her Qsymia  for a month due to being on cold medicine and appetite did increase She is finally starting to feel better without cough or congestion She has restarted her Qsymia  7.5/46 mg once daily without adverse side effects She was very tired and wasn't able to exercise while sick She plans to resume chair exercises Her net weight loss is  54 pounds in the past 16 months of medically supervised weight management This is a 17% total body weight loss  Pharmacotherapy: Qsymia  7.5/46 mg each morning  PHYSICAL EXAM:  Blood pressure 135/82, pulse 66, temperature 98.4 F (36.9 C), height 5' 4 (1.626 m), weight 262 lb (118.8 kg), SpO2 96%. Body mass index is 44.97 kg/m.  General: She is overweight, cooperative, alert, well developed, and in no acute distress. PSYCH: Has normal mood, affect and thought process.   Lungs: Normal breathing effort, no conversational dyspnea.   ASSESSMENT AND PLAN  TREATMENT PLAN FOR OBESITY:  Recommended Dietary Goals  Shelby Clarke is currently in the action stage of  change. As such, her goal is to continue weight management plan. She has agreed to keeping a food journal and adhering to recommended goals of 1500 calories and 85 g of protein.  Behavioral Intervention  We discussed the following Behavioral Modification Strategies today: increasing lean protein intake to established goals, increasing fiber rich foods, increasing water intake , work on meal planning and preparation, work on tracking and journaling calories using tracking application, reading food labels , continue to practice mindfulness when eating, planning for success, and continue to work on maintaining a reduced calorie state, getting the recommended amount of protein, incorporating whole foods, making healthy choices, staying well hydrated and practicing mindfulness when eating..  Additional resources provided today: NA  Recommended Physical Activity Goals  Shelby Clarke has been advised to work up to 150 minutes of moderate intensity aerobic activity a week and strengthening exercises 2-3 times per week for cardiovascular health, weight loss maintenance and preservation of muscle mass.   She has agreed to Think about enjoyable ways to increase daily physical activity and overcoming barriers to exercise and Increase physical activity in their day and reduce sedentary time (increase NEAT). Begin chair exercises 20 minutes daily  Pharmacotherapy changes for the treatment of obesity: None  ASSOCIATED CONDITIONS ADDRESSED TODAY  Pre-diabetes Lab Results  Component Value Date   HGBA1C 5.6 02/26/2024  A1c has improved.  Reviewed labs from last visit.  She has been continuing to take metformin  XR 500 mg daily though  it had been discontinued from her medication list last visit.  She agrees to discontinuing metformin  at this point in time and we will follow her A1c and fasting insulin  off medication.  Continue to work on a low sugar/low starch diet, regular exercise and weight reduction.  Vitamin D   deficiency Last vitamin D  Lab Results  Component Value Date   VD25OH 47.2 02/26/2024  Improved.  Her vitamin D  level has improved on vitamin D  50,000 IU once weekly.  Reviewed labs from last visit.  Energy level has improved though was less than while sick recently.  Begin an over-the-counter vitamin D  2000 IU once daily supplement and recheck in follow-up.  Obesity, Class III, BMI 40-49.9 (morbid obesity) Continue Qsymia  7.5/46 mg once daily with breakfast.  Appetite control has improved on medications and benefits exceeds the risk.  Blood pressure and heart rate are within normal limits.  Other specified hypothyroidism Lab Results  Component Value Date   TSH 1.730 02/26/2024  Reviewed labs from last visit.  She reports good compliance taking levothyroxine  50 mcg once daily.  This may be taken over by her PCP and she denied need for refills today.  Continue current dose of thyroid replacement.  Essential hypertension Blood pressure is at goal on lisinopril  5 mg once daily She denies adverse side effects. Continue current medications along with active plan for weight reduction  Gastroesophageal reflux disease without esophagitis -     Omeprazole ; Take 1 capsule (40 mg total) by mouth daily.  Dispense: 30 capsule; Refill: 3 Worsened.  She was having a lot of breakthrough acid reflux on famotidine  40 mg at bedtime.  She avoids late-night eating and high acid foods.  She denies regurgitation of undigested food or melena or hematochezia.  She had good success on omeprazole  in the past.  Will discontinue famotidine  and replace with omeprazole  40 mg once daily in the evening.  Continue active plan for weight reduction.   She was informed of the importance of frequent follow up visits to maximize her success with intensive lifestyle modifications for her multiple health conditions.   ATTESTASTION STATEMENTS:  Reviewed by clinician on day of visit: allergies, medications, problem list,  medical history, surgical history, family history, social history, and previous encounter notes pertinent to obesity diagnosis.   I have personally spent 31 minutes total time today in preparation, patient care, nutritional counseling and education,  and documentation for this visit, including the following: review of most recent clinical lab tests, prescribing medications/ refilling medications, reviewing medical assistant documentation, review and interpretation of bioimpedence results.     Darice Haddock, D.O. DABFM, DABOM [facility] [address] [place], [place] [address] [phone]

## 2024-04-20 NOTE — Patient Instructions (Signed)
 Resume Qsymia  7.5/46 mg in the mornings  Change RX vitamin D  to OTC vitamin D3 2,000 international units  daily  Stop Metformin   Change Famotidine  to Omeprazole  in the evening for acid reflux  Aim for chair exercises from home 20 min 4 days/ wk

## 2024-05-26 ENCOUNTER — Ambulatory Visit: Admitting: Family Medicine

## 2024-06-02 ENCOUNTER — Ambulatory Visit: Admitting: Family Medicine

## 2024-10-13 ENCOUNTER — Other Ambulatory Visit: Payer: Self-pay | Admitting: Physician Assistant

## 2024-10-13 DIAGNOSIS — Z1231 Encounter for screening mammogram for malignant neoplasm of breast: Secondary | ICD-10-CM

## 2024-10-21 ENCOUNTER — Encounter: Payer: Self-pay | Admitting: Family Medicine

## 2024-10-21 ENCOUNTER — Ambulatory Visit: Admitting: Family Medicine

## 2024-10-21 VITALS — BP 142/79 | HR 76 | Temp 98.3°F | Ht 64.0 in | Wt 287.0 lb

## 2024-10-21 DIAGNOSIS — E559 Vitamin D deficiency, unspecified: Secondary | ICD-10-CM | POA: Diagnosis not present

## 2024-10-21 DIAGNOSIS — E038 Other specified hypothyroidism: Secondary | ICD-10-CM

## 2024-10-21 DIAGNOSIS — Z6841 Body Mass Index (BMI) 40.0 and over, adult: Secondary | ICD-10-CM | POA: Diagnosis not present

## 2024-10-21 DIAGNOSIS — R7303 Prediabetes: Secondary | ICD-10-CM

## 2024-10-21 DIAGNOSIS — I10 Essential (primary) hypertension: Secondary | ICD-10-CM | POA: Diagnosis not present

## 2024-10-21 MED ORDER — VITAMIN D (ERGOCALCIFEROL) 1.25 MG (50000 UNIT) PO CAPS: 50000.0000 [IU] | ORAL_CAPSULE | ORAL | 0 refills | Status: AC

## 2024-10-21 MED ORDER — ZEPBOUND 2.5 MG/0.5ML ~~LOC~~ SOAJ: 2.5000 mg | SUBCUTANEOUS | 0 refills | Status: DC

## 2024-10-21 NOTE — Progress Notes (Signed)
 "  Office: [phone]  /  Fax: [phone]  WEIGHT SUMMARY AND BIOMETRICS  Starting Date: 12/18/24  Starting Weight: 316lb   Weight Lost Since Last Visit: 0lb   Vitals Temp: 98.3 F (36.8 C) BP: (!) 142/79 Pulse Rate: 76 SpO2: 95 %   Body Composition  Body Fat %: 58.9 % Fat Mass (lbs): 169.2 lbs Muscle Mass (lbs): 112 lbs Visceral Fat Rating : 22    HPI  Chief Complaint: OBESITY  Shelby Clarke is here to discuss her progress with her obesity treatment plan. She is on the keeping a food journal and adhering to recommended goals of 1600 calories and 110-120 protein and states she is following her eating plan approximately 20 % of the time. She states she is exercising 0 minutes 0 times per week.  Interval History:  Since last office visit she is up 25 lb She was last seen 6 mos ago She has a cruise upcoming this year and would like to lose weight She has a good support system She has been off Qsymia  since August She was sick for 2-3 mos with a respiratory infection, required steroids which caused weight gain She has a net weight loss of 29 lb in 10 mos of medically supervised weight management She is eating all 3 meals and is staying up late at night She feels ready to get back on track She is currently doing no regular exercise  Pharmacotherapy: None  PHYSICAL EXAM:  Blood pressure (!) 142/79, pulse 76, temperature 98.3 F (36.8 C), height 5' 4 (1.626 m), weight 287 lb (130.2 kg), SpO2 95%. Body mass index is 49.26 kg/m.  General: She is overweight, cooperative, alert, well developed, and in no acute distress. PSYCH: Has normal mood, affect and thought process.   Lungs: Normal breathing effort, no conversational dyspnea.   ASSESSMENT AND PLAN  TREATMENT PLAN FOR OBESITY:  Recommended Dietary Goals  Shelby Clarke is currently in the action stage of change. As such, her goal is to continue weight management plan. She has agreed to practicing portion control and  making smarter food choices, such as increasing vegetables and decreasing simple carbohydrates.  Behavioral Intervention  We discussed the following Behavioral Modification Strategies today: increasing lean protein intake to established goals, increasing fiber rich foods, work on meal planning and preparation, keeping healthy foods at home, continue to practice mindfulness when eating, and planning for success.  Additional resources provided today: NA  Recommended Physical Activity Goals  Shelby Clarke has been advised to work up to 150 minutes of moderate intensity aerobic activity a week and strengthening exercises 2-3 times per week for cardiovascular health, weight loss maintenance and preservation of muscle mass.   She has agreed to Think about enjoyable ways to increase daily physical activity and overcoming barriers to exercise and Increase physical activity in their day and reduce sedentary time (increase NEAT).  Pharmacotherapy changes for the treatment of obesity: Begin Zepbound  2.5 mg once weekly injection Patient denies a personal or family history of pancreatitis, medullary thyroid carcinoma or multiple endocrine neoplasia type II. Recommend reviewing pen training video online.  ASSOCIATED CONDITIONS ADDRESSED TODAY  Vitamin D  deficiency Last vitamin D  Lab Results  Component Value Date   VD25OH 47.2 02/26/2024  Vitamin D  level reviewed from her PCP, brought in today dated 1//26.  Vitamin D  level was low at 26.2.  She was instructed to take OTC vitamin D  at 1000 IU once daily which she is currently not taking.  She has some complaints of fatigue.  Discontinue OTC vitamin D  and replace with vitamin D  50,000 IU once weekly aiming for a vitamin D  level over 50.  Repeat lab in 3 to 4 months.  -     Vitamin D  (Ergocalciferol ); Take 1 capsule (50,000 Units total) by mouth every 7 (seven) days.  Dispense: 12 capsule; Refill: 0  Morbid obesity (HCC) -     Zepbound ; Inject 2.5 mg into the  skin once a week.  Dispense: 2 mL; Refill: 0 Worsening.  She has seen some weight weight regain still net down 29 pounds in 10 months medically supervised weight management, off plan for the past 6 months.  She is motivated get back on track.  She is an excellent candidate for use of Zepbound  in combination with a reduced calorie healthy diet and more consistent exercise.  Patient was counseled on the importance of maintaining healthy lifestyle habits, including balanced nutrition, regular physical activity, and behavioral modifications, while taking antiobesity medication.  Patient verbalized understanding that medication is an adjunct to, not a replacement for, lifestyle changes and that the long-term success and weight maintenance depend on continued adherence to these strategies.  BMI 45.0-49.9, adult Shelby Clarke)  Pre-diabetes Lab Results  Component Value Date   HGBA1C 5.6 02/26/2024  A1c was repeated by her PCP dated 1//26 at 5.8.  This is worse from her previous A1c.  Begin working on reducing added sugar and refined carbohydrate intake.  She has been off metformin  for about 5 months.  Essential hypertension Blood pressure is elevated today with weight gain. Chemistry panel was reviewed from her labs dated 1//26 showing normal renal function.  Continue lisinopril  5 mg once daily.  Avoid use of phentermine , Qsymia  or Contrave with poorly controlled hypertension.  Look for improvements with weight loss.  Other specified hypothyroidism She reports good compliance taking levothyroxine  50 mcg once daily.  Her TSH was rechecked by her PCP dated 10/21/2024 at 2.52.     She was informed of the importance of frequent follow up visits to maximize her success with intensive lifestyle modifications for her multiple health conditions.   ATTESTASTION STATEMENTS:  Reviewed by clinician on day of visit: allergies, medications, problem list, medical history, surgical history, family history, social  history, and previous encounter notes pertinent to obesity diagnosis.   I have personally spent 30 minutes total time today in preparation, patient care, nutritional counseling and education,  and documentation for this visit, including the following: review of most recent clinical lab tests, prescribing medications/ refilling medications, reviewing medical assistant documentation, review and interpretation of bioimpedence results.     Darice Haddock, D.O. DABFM, DABOM [facility] [id number] [place], [place] [address] [phone] "

## 2024-10-22 ENCOUNTER — Telehealth: Payer: Self-pay | Admitting: *Deleted

## 2024-10-22 NOTE — Telephone Encounter (Signed)
 Prior authorization done via cover my meds for patients Zepbound. Waiting on determination.

## 2024-10-27 NOTE — Telephone Encounter (Signed)
 Zepbound  denied. Her plan prefers Wegovy.

## 2024-11-03 ENCOUNTER — Other Ambulatory Visit: Payer: Self-pay | Admitting: Family Medicine

## 2024-11-03 MED ORDER — WEGOVY 0.25 MG/0.5ML ~~LOC~~ SOAJ: 0.2500 mg | SUBCUTANEOUS | 0 refills | Status: AC

## 2024-11-05 NOTE — Telephone Encounter (Signed)
 Wegovy  was approved.  Message from plan: Your PA request has been approved. Additional information will be provided in the approval communication. (Message 1145). Authorization Expiration Date: June 04, 2025.

## 2024-11-10 ENCOUNTER — Encounter

## 2024-11-17 ENCOUNTER — Ambulatory Visit: Admitting: Family Medicine

## 2024-12-10 ENCOUNTER — Encounter
